# Patient Record
Sex: Female | Born: 2000 | Race: White | Hispanic: No | Marital: Single | Smoking: Never smoker
Health system: Southern US, Community
[De-identification: ages and names within clinical notes are randomized; demographics above are authoritative.]

## PROBLEM LIST (undated history)

## (undated) ENCOUNTER — Inpatient Hospital Stay (HOSPITAL_COMMUNITY): Payer: Self-pay

## (undated) DIAGNOSIS — E039 Hypothyroidism, unspecified: Secondary | ICD-10-CM

## (undated) DIAGNOSIS — G43909 Migraine, unspecified, not intractable, without status migrainosus: Secondary | ICD-10-CM

## (undated) DIAGNOSIS — R011 Cardiac murmur, unspecified: Secondary | ICD-10-CM

## (undated) DIAGNOSIS — E0590 Thyrotoxicosis, unspecified without thyrotoxic crisis or storm: Secondary | ICD-10-CM

## (undated) DIAGNOSIS — F419 Anxiety disorder, unspecified: Secondary | ICD-10-CM

## (undated) DIAGNOSIS — R350 Frequency of micturition: Secondary | ICD-10-CM

## (undated) DIAGNOSIS — J45909 Unspecified asthma, uncomplicated: Secondary | ICD-10-CM

## (undated) HISTORY — DX: Cardiac murmur, unspecified: R01.1

## (undated) HISTORY — DX: Anxiety disorder, unspecified: F41.9

---
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
## 2018-05-03 ENCOUNTER — Other Ambulatory Visit: Payer: Self-pay

## 2018-05-03 ENCOUNTER — Inpatient Hospital Stay (HOSPITAL_BASED_OUTPATIENT_CLINIC_OR_DEPARTMENT_OTHER): Payer: Managed Care, Other (non HMO)

## 2018-05-03 ENCOUNTER — Inpatient Hospital Stay (HOSPITAL_COMMUNITY)
Admission: AD | Admit: 2018-05-03 | Discharge: 2018-05-04 | DRG: 833 | Disposition: A | Discharge status: Home / Self Care | Payer: Managed Care, Other (non HMO) | Attending: Obstetrics and Gynecology | Admitting: Obstetrics and Gynecology

## 2018-05-03 ENCOUNTER — Encounter (HOSPITAL_COMMUNITY): Payer: Self-pay | Admitting: *Deleted

## 2018-05-03 DIAGNOSIS — Z3A28 28 weeks gestation of pregnancy: Secondary | ICD-10-CM | POA: Diagnosis not present

## 2018-05-03 DIAGNOSIS — O26893 Other specified pregnancy related conditions, third trimester: Secondary | ICD-10-CM | POA: Diagnosis not present

## 2018-05-03 DIAGNOSIS — R109 Unspecified abdominal pain: Secondary | ICD-10-CM

## 2018-05-03 HISTORY — DX: Thyrotoxicosis, unspecified without thyrotoxic crisis or storm: E05.90

## 2018-05-03 LAB — WET PREP, GENITAL
Clue Cells Wet Prep HPF POC: NONE SEEN
Sperm: NONE SEEN
TRICH WET PREP: NONE SEEN
YEAST WET PREP: NONE SEEN

## 2018-05-03 LAB — URINALYSIS, ROUTINE W REFLEX MICROSCOPIC
BILIRUBIN URINE: NEGATIVE
Glucose, UA: NEGATIVE mg/dL
Hgb urine dipstick: NEGATIVE
KETONES UR: 20 mg/dL — AB
LEUKOCYTES UA: NEGATIVE
Nitrite: NEGATIVE
Protein, ur: NEGATIVE mg/dL
SPECIFIC GRAVITY, URINE: 1.013 (ref 1.005–1.030)
pH: 7 (ref 5.0–8.0)

## 2018-05-03 LAB — AMNISURE RUPTURE OF MEMBRANE (ROM) NOT AT ARMC: AMNISURE: NEGATIVE

## 2018-05-03 LAB — OB RESULTS CONSOLE GBS: GBS: POSITIVE

## 2018-05-03 MED ORDER — DOCUSATE SODIUM 100 MG PO CAPS: 100.0000 mg | ORAL_CAPSULE | Freq: Every day | ORAL | Status: DC

## 2018-05-03 MED ORDER — LACTATED RINGERS IV BOLUS
1000.0000 mL | Freq: Once | INTRAVENOUS | Status: AC
  Administered 2018-05-03: 1000 mL via INTRAVENOUS

## 2018-05-03 MED ORDER — ZOLPIDEM TARTRATE 5 MG PO TABS: 5.0000 mg | ORAL_TABLET | Freq: Every evening | ORAL | Status: DC | PRN

## 2018-05-03 MED ORDER — PRENATAL MULTIVITAMIN CH: 1.0000 | ORAL_TABLET | Freq: Every day | ORAL | Status: DC

## 2018-05-03 MED ORDER — NIFEDIPINE 10 MG PO CAPS
10.0000 mg | ORAL_CAPSULE | ORAL | Status: AC | PRN
  Administered 2018-05-03 (×3): 10 mg via ORAL
  Filled 2018-05-03 (×4): qty 1

## 2018-05-03 MED ORDER — CALCIUM CARBONATE ANTACID 500 MG PO CHEW: 2.0000 | CHEWABLE_TABLET | ORAL | Status: DC | PRN

## 2018-05-03 MED ORDER — ACETAMINOPHEN 325 MG PO TABS
650.0000 mg | ORAL_TABLET | Freq: Four times a day (QID) | ORAL | Status: DC | PRN
  Administered 2018-05-04 (×2): 650 mg via ORAL
  Filled 2018-05-03 (×2): qty 2

## 2018-05-03 MED ORDER — BETAMETHASONE SOD PHOS & ACET 6 (3-3) MG/ML IJ SUSP
12.0000 mg | INTRAMUSCULAR | Status: DC
  Administered 2018-05-03: 12 mg via INTRAMUSCULAR
  Filled 2018-05-03: qty 2

## 2018-05-03 NOTE — MAU Provider Note (Signed)
Chief Complaint:  Abdominal Pain; Vaginal Pain; Groin Swelling; and Rupture of Membranes   First Provider Initiated Contact with Patient 05/03/18 1809      HPI: Jacqueline Meadows is a 17 y.o. G1P0000 at 24w0dwho presents to maternity admissions reporting a gush of fluid x 1 episode in the shower this morning with some wetness but no more gushes all day long today. She also reports she had intercourse and a sexual encounter with her boyfriend that lasted 2.5 hours today followed by vaginal swelling and abdominal cramping. The cramping is low in her abdomen, intermittent, and unchanged in intensity since onset.  She has not tried any treatments. There are no other associated symptoms.   She reports good fetal movement, denies vaginal bleeding, vaginal itching/burning, urinary symptoms, h/a, dizziness, n/v, or fever/chills.    HPI  Past Medical History: Past Medical History:  Diagnosis Date  . Anxiety   . Asthma   . Heart murmur    resolved by age [age]y/o.  [name] Kitchen Hyperthyroidism   . Migraine   . Premature baby    7 weeks early    Past obstetric history: OB History  Gravida Para Term Preterm AB Living  1 0 0 0 0 0  SAB TAB Ectopic Multiple Live Births  0 0 0 0 0    # Outcome Date GA Lbr Len/2nd Weight Sex Delivery Anes PTL Lv  1 Current             Past Surgical History: Past Surgical History:  Procedure Laterality Date  . FOOT SURGERY Bilateral 03-2015   Rods in both feet    Family History: Family History  Problem Relation Age of Onset  . Anxiety disorder Mother   . Depression Mother   . ADD / ADHD Mother   . Bipolar disorder Mother   . Migraines Mother   . Anxiety disorder Brother   . Depression Brother   . Bipolar disorder Paternal Aunt        3 paternal aunts bipolar dx and on disability  . Suicidality Paternal Aunt        paternal aunt attempted suicide 2 times  . Other Paternal Aunt        Hx of substance abuse  . ADD / ADHD Brother   . Ovarian cancer Maternal  Aunt     Social History: Social History   Tobacco Use  . Smoking status: Never Smoker  . Smokeless tobacco: Never Used  Substance Use Topics  . Alcohol use: No  . Drug use: No    Allergies:  Allergies  Allergen Reactions  . Vicodin [Hydrocodone-Acetaminophen] Rash    Meds:  Medications Prior to Admission  Medication Sig Dispense Refill Last Dose  . acetaminophen (TYLENOL) 500 MG tablet Take 1,000 mg by mouth every 6 (six) hours as needed.   Taking  . HYDROcodone-acetaminophen (NORCO) 10-325 MG tablet Take 1 tablet every 6 (six) hours as needed by mouth. Dispensed 25 with no refills     . ibuprofen (ADVIL,MOTRIN) 200 MG tablet Take 800 mg by mouth every 8 (eight) hours as needed.    Taking  . meperidine (DEMEROL) 50 MG tablet Take 1 tablet (50 mg total) every 6 (six) hours as needed by mouth for severe pain. 20 tablet 0   . promethazine (PHENERGAN) 12.5 MG tablet 1-2 tablets as needed for headache and nausea 30 tablet 3   . rizatriptan (MAXALT-MLT) 10 MG disintegrating tablet Take 1 tablet (10 mg total) by mouth as needed  for migraine. May repeat in 2 hours if needed 12 tablet 11     ROS:  Review of Systems  Constitutional: Negative for chills, fatigue and fever.  Eyes: Negative for visual disturbance.  Respiratory: Negative for shortness of breath.   Cardiovascular: Negative for chest pain.  Gastrointestinal: Positive for abdominal pain. Negative for nausea and vomiting.  Genitourinary: Positive for pelvic pain and vaginal discharge. Negative for difficulty urinating, dysuria, flank pain, vaginal bleeding and vaginal pain.  Neurological: Negative for dizziness and headaches.  Psychiatric/Behavioral: Negative.      I have reviewed patient's Past Medical Hx, Surgical Hx, Family Hx, Social Hx, medications and allergies.   Physical Exam   Patient Vitals for the past 24 hrs:  BP Temp Temp src Pulse Resp SpO2 Height Weight  05/03/18 2100 - - - - - 100 % - -  05/03/18 2033  108/62 - - 91 18 100 % - -  05/03/18 2014 - 99 F (37.2 C) Oral - - - 5' 5.5" (1.664 m) 57.2 kg  05/03/18 1905 127/68 - - - - - - -  05/03/18 1659 (!) 143/78 98.5 F (36.9 C) Oral 100 16 100 % 5' 5.5" (1.664 m) 57.2 kg   Constitutional: Well-developed, well-nourished female in no acute distress.  Cardiovascular: normal rate Respiratory: normal effort GI: Abd soft, non-tender, gravid appropriate for gestational age.  MS: Extremities nontender, no edema, normal ROM Neurologic: Alert and oriented x 4.  GU: Neg CVAT.  PELVIC EXAM: Cervix pink, visually closed, without lesion, moderate amount milky white discharge, no bleeding noted, vaginal walls and external genitalia with mild edema and erythema   Dilation: 1 Effacement (%): 50 Station: -3 Exam by:: Misty Stanley, CNM  FHT:  Baseline 140 , moderate variability, accelerations present, no decelerations Contractions: q 2-3 mins, mild to palpation   Labs: Results for orders placed or performed during the hospital encounter of 05/03/18 (from the past 24 hour(s))  Urinalysis, Routine w reflex microscopic     Status: Abnormal   Collection Time: 05/03/18  5:37 PM  Result Value Ref Range   Color, Urine YELLOW YELLOW   APPearance CLEAR CLEAR   Specific Gravity, Urine 1.013 1.005 - 1.030   pH 7.0 5.0 - 8.0   Glucose, UA NEGATIVE NEGATIVE mg/dL   Hgb urine dipstick NEGATIVE NEGATIVE   Bilirubin Urine NEGATIVE NEGATIVE   Ketones, ur 20 (A) NEGATIVE mg/dL   Protein, ur NEGATIVE NEGATIVE mg/dL   Nitrite NEGATIVE NEGATIVE   Leukocytes, UA NEGATIVE NEGATIVE  Amnisure rupture of membrane (rom)not at [facility]     Status: None   Collection Time: 05/03/18  5:49 PM  Result Value Ref Range   Amnisure ROM NEGATIVE   Wet prep, genital     Status: Abnormal   Collection Time: 05/03/18  5:59 PM  Result Value Ref Range   Yeast Wet Prep HPF POC NONE SEEN NONE SEEN   Trich, Wet Prep NONE SEEN NONE SEEN   Clue Cells Wet Prep HPF POC NONE SEEN NONE SEEN   WBC,  Wet Prep HPF POC MANY (A) NONE SEEN   Sperm NONE SEEN   Type and screen [facility] [place]     Status: None   Collection Time: 05/03/18  6:20 PM  Result Value Ref Range   ABO/RH(D) A NEG    Antibody Screen POS    Sample Expiration      05/06/2018 Performed at [facility], [address] [place], [place] [address]    --/--/A  NEG (10/30 1820)  Imaging:  No results found.  MAU Course/MDM: NST reviewed and reactive Cervix 1/50/-3, no pooling on exam and amnisure negative so no evidence of PPROM but possible preterm labor with early cervical dilation IV fluids given, BMZ 1st dose given Bedside US to confirm vertex Consult Dr Renaldo Fiddler with presentation, exam findings and test results.  Procardia 10 mg Q 20 minutes PRN x 3 doses ordered and pt given first dose. Second dose held with low BP.  Limited OB US ordered Pt admitted to HROB U for further evaluation and observation Second BMZ in 24 hours IV fluids Dr Renaldo Fiddler to see pt   Assessment: 1. Preterm labor in third trimester without delivery   2. [redacted] weeks gestation of pregnancy   3. Abdominal pain during pregnancy, third trimester     Plan: Admit to HROB Unit BMZ x 2 in 24 hours IV fluids [place]    Sharen Counter Certified Nurse-Midwife 05/03/2018 10:03 PM

## 2018-05-03 NOTE — H&P (Signed)
Jacqueline Meadows is a 17 y.o. female G1 @ 28wks presenting with c/o contractions.  Pt reports she had an episode of leaking fluid at 7am in the shower this morning.   After intercourse approx 11am, she noted an increase in contractions occurring Q3-18min and becoming more uncomfortable.  No vb and no further LOF.  Good FM.  Pt was given one dose of procardia 10mg  in MAU and noted decrease in ctx intensity.  Amniosure was negative.   Pregnancy uncomplicated.  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   OB History    Gravida  1   Para  0   Term  0   Preterm  0   AB  0   Living  0     SAB  0   TAB  0   Ectopic  0   Multiple  0   Live Births  0          Past Medical History:  Diagnosis Date  . Anxiety   . Asthma   . Heart murmur    resolved by age [age]y/o.  [name] Kitchen Hyperthyroidism   . Migraine   . Premature baby    7 weeks early   Past Surgical History:  Procedure Laterality Date  . FOOT SURGERY Bilateral 03-2015   Rods in both feet   Family History: family history includes ADD / ADHD in her brother and mother; Anxiety disorder in her brother and mother; Bipolar disorder in her mother and paternal aunt; Depression in her brother and mother; Migraines in her mother; Other in her paternal aunt; Ovarian cancer in her maternal aunt; Suicidality in her paternal aunt. Social History:  reports that she has never smoked. She has never used smokeless tobacco. She reports that she does not drink alcohol or use drugs.     Maternal Diabetes: No - GTT done at last OB appt, hasn't heard results Genetic Screening: Normal Maternal Ultrasounds/Referrals: normal Fetal Ultrasounds or other Referrals:  None Maternal Substance Abuse:  No Significant Maternal Medications:  None Significant Maternal Lab Results:  None Other Comments:  None  Blood pressure 127/68, pulse 100, temperature 99 F (37.2 C), temperature source Oral, resp. rate 16, height 5'  5.5" (1.664 m), weight 57.2 kg, last menstrual period 10/19/2017, SpO2 100 %. Exam Physical Exam   AF, VSS + FHT, reassuring Toco Q2-108min  Gen - NAD Abd - gravid, NT Ext - NT, no edema Cvx 1cm by CNM exam in MAU  Prenatal labs: ABO, Rh: --/--/A NEG (10/30 1820) Antibody: POS (10/30 1820) Rubella:   RPR:    HBsAg:    HIV:    GBS:   pending  Results for orders placed or performed during the hospital encounter of 05/03/18 (from the past 24 hour(s))  Urinalysis, Routine w reflex microscopic     Status: Abnormal   Collection Time: 05/03/18  5:37 PM  Result Value Ref Range   Color, Urine YELLOW YELLOW   APPearance CLEAR CLEAR   Specific Gravity, Urine 1.013 1.005 - 1.030   pH 7.0 5.0 - 8.0   Glucose, UA NEGATIVE NEGATIVE mg/dL   Hgb urine dipstick NEGATIVE NEGATIVE   Bilirubin Urine NEGATIVE NEGATIVE   Ketones, ur 20 (A) NEGATIVE mg/dL   Protein, ur NEGATIVE NEGATIVE mg/dL   Nitrite NEGATIVE NEGATIVE   Leukocytes, UA NEGATIVE NEGATIVE  Amnisure rupture of membrane (rom)not at [facility]     Status: None   Collection Time: 05/03/18  5:49 PM  Result Value Ref Range   Amnisure ROM NEGATIVE   Wet prep, genital     Status: Abnormal   Collection Time: 05/03/18  5:59 PM  Result Value Ref Range   Yeast Wet Prep HPF POC NONE SEEN NONE SEEN   Trich, Wet Prep NONE SEEN NONE SEEN   Clue Cells Wet Prep HPF POC NONE SEEN NONE SEEN   WBC, Wet Prep HPF POC MANY (A) NONE SEEN   Sperm NONE SEEN   Type and screen [facility]      Status: None   Collection Time: 05/03/18  6:20 PM  Result Value Ref Range   ABO/RH(D) A NEG    Antibody Screen POS    Sample Expiration      05/06/2018 Performed at [facility], [address], [place], [place] [address]     [place]:  cvx length 3.5cm, no funneling.  vtx   Assessment/Plan: 17 yo G1 @ 28 wks Preterm Labor  Continue procardia series, will start Mag tocolysis if ctx worsen S/p BMZ x 1, rpt in 24 hrs    Zelphia Cairo 05/03/2018, 8:31 PM

## 2018-05-03 NOTE — MAU Note (Signed)
Pt reports vaginal swelling and pain since intercourse this am, hurts to urinate. Pt reports abd tightening off/on ? Leaking fluid

## 2018-05-04 LAB — TYPE AND SCREEN
ABO/RH(D): A NEG
Antibody Screen: POSITIVE
UNIT DIVISION: 0
Unit division: 0

## 2018-05-04 LAB — BPAM RBC
Blood Product Expiration Date: 201911242359
Blood Product Expiration Date: 201911242359
Unit Type and Rh: 600
Unit Type and Rh: 600

## 2018-05-04 LAB — GC/CHLAMYDIA PROBE AMP (~~LOC~~) NOT AT ARMC
CHLAMYDIA, DNA PROBE: NEGATIVE
NEISSERIA GONORRHEA: NEGATIVE

## 2018-05-04 MED ORDER — BETAMETHASONE SOD PHOS & ACET 6 (3-3) MG/ML IJ SUSP
12.0000 mg | INTRAMUSCULAR | Status: AC
  Administered 2018-05-04: 12 mg via INTRAMUSCULAR
  Filled 2018-05-04: qty 2

## 2018-05-04 NOTE — Discharge Summary (Signed)
Admission Diagnosis: IUP at 28 weeks Preterm contractions  Discharge Diagnosis: Same  Hospital Course: 17 year old G 1 P 0 at 28 weeks admitted with pre term contractions that occurred after intercourse. Cervix noted to be 1 cm dilated. She was admitted for observation and given steroids and procardia. She stopped contracting relatively easily and was observed overnight.  BP 104/66 (BP Location: Right Arm)   Pulse 90   Temp 97.9 F (36.6 C) (Oral)   Resp 16   Ht 5' 5.5" (1.664 m)   Wt 57.2 kg   LMP 10/19/2017   SpO2 100%   BMI 20.67 kg/m  General alert and oriented Lung CTAB Car RRR  Results for orders placed or performed during the hospital encounter of 05/03/18 (from the past 24 hour(s))  Urinalysis, Routine w reflex microscopic     Status: Abnormal   Collection Time: 05/03/18  5:37 PM  Result Value Ref Range   Color, Urine YELLOW YELLOW   APPearance CLEAR CLEAR   Specific Gravity, Urine 1.013 1.005 - 1.030   pH 7.0 5.0 - 8.0   Glucose, UA NEGATIVE NEGATIVE mg/dL   Hgb urine dipstick NEGATIVE NEGATIVE   Bilirubin Urine NEGATIVE NEGATIVE   Ketones, ur 20 (A) NEGATIVE mg/dL   Protein, ur NEGATIVE NEGATIVE mg/dL   Nitrite NEGATIVE NEGATIVE   Leukocytes, UA NEGATIVE NEGATIVE  Amnisure rupture of membrane (rom)not at [facility]     Status: None   Collection Time: 05/03/18  5:49 PM  Result Value Ref Range   Amnisure ROM NEGATIVE   Wet prep, genital     Status: Abnormal   Collection Time: 05/03/18  5:59 PM  Result Value Ref Range   Yeast Wet Prep HPF POC NONE SEEN NONE SEEN   Trich, Wet Prep NONE SEEN NONE SEEN   Clue Cells Wet Prep HPF POC NONE SEEN NONE SEEN   WBC, Wet Prep HPF POC MANY (A) NONE SEEN   Sperm NONE SEEN   Type and screen [facility] [place]     Status: None (Preliminary result)   Collection Time: 05/03/18  6:20 PM  Result Value Ref Range   ABO/RH(D) A NEG    Antibody Screen POS    Sample Expiration 05/06/2018    Antibody Identification     PASSIVELY ACQUIRED ANTI-D Performed at [facility], [address] [place], [place] [address]    Unit Number [id number]    Blood Component Type RED CELLS,LR    Unit division 00    Status of Unit ALLOCATED    Transfusion Status OK TO TRANSFUSE    Crossmatch Result COMPATIBLE    Unit Number [id number]    Blood Component Type RED CELLS,LR    Unit division 00    Status of Unit ALLOCATED    Transfusion Status OK TO TRANSFUSE    Crossmatch Result COMPATIBLE    Patient discharged home in good condition Modified bedrest Followup in 1 week

## 2018-05-04 NOTE — Progress Notes (Signed)
Patient discharged home with significant other and her mother. Discharge teaching, home care, s/s PTL, and reasons to seek care discussed. Pt verbalized understanding.

## 2018-05-05 ENCOUNTER — Inpatient Hospital Stay (HOSPITAL_COMMUNITY)
Admission: AD | Admit: 2018-05-05 | Discharge: 2018-05-05 | Disposition: A | Discharge status: Home / Self Care | Payer: Managed Care, Other (non HMO) | Source: Ambulatory Visit | Attending: Obstetrics and Gynecology | Admitting: Obstetrics and Gynecology

## 2018-05-05 ENCOUNTER — Encounter (HOSPITAL_COMMUNITY): Payer: Self-pay | Admitting: *Deleted

## 2018-05-05 DIAGNOSIS — Z3A28 28 weeks gestation of pregnancy: Secondary | ICD-10-CM

## 2018-05-05 DIAGNOSIS — O4703 False labor before 37 completed weeks of gestation, third trimester: Secondary | ICD-10-CM | POA: Diagnosis not present

## 2018-05-05 DIAGNOSIS — O4702 False labor before 37 completed weeks of gestation, second trimester: Secondary | ICD-10-CM

## 2018-05-05 DIAGNOSIS — Z3689 Encounter for other specified antenatal screening: Secondary | ICD-10-CM

## 2018-05-05 LAB — URINALYSIS, ROUTINE W REFLEX MICROSCOPIC
Bilirubin Urine: NEGATIVE
Glucose, UA: NEGATIVE mg/dL
Ketones, ur: NEGATIVE mg/dL
Nitrite: NEGATIVE
Protein, ur: NEGATIVE mg/dL
Specific Gravity, Urine: 1.011 (ref 1.005–1.030)
pH: 7 (ref 5.0–8.0)

## 2018-05-05 LAB — CULTURE, BETA STREP (GROUP B ONLY)

## 2018-05-05 MED ORDER — NIFEDIPINE 10 MG PO CAPS: 10.0000 mg | ORAL_CAPSULE | Freq: Four times a day (QID) | ORAL | 0 refills | Status: DC | PRN

## 2018-05-05 NOTE — Discharge Instructions (Signed)
Braxton Hicks Contractions °Contractions of the uterus can occur throughout pregnancy, but they are not always a sign that you are in labor. You may have practice contractions called Braxton Hicks contractions. These false labor contractions are sometimes confused with true labor. °What are Braxton Hicks contractions? °Braxton Hicks contractions are tightening movements that occur in the muscles of the uterus before labor. Unlike true labor contractions, these contractions do not result in opening (dilation) and thinning of the cervix. Toward the end of pregnancy (32-34 weeks), Braxton Hicks contractions can happen more often and may become stronger. These contractions are sometimes difficult to tell apart from true labor because they can be very uncomfortable. You should not feel embarrassed if you go to the hospital with false labor. °Sometimes, the only way to tell if you are in true labor is for your health care provider to look for changes in the cervix. The health care provider will do a physical exam and may monitor your contractions. If you are not in true labor, the exam should show that your cervix is not dilating and your water has not broken. °If there are other health problems associated with your pregnancy, it is completely safe for you to be sent home with false labor. You may continue to have Braxton Hicks contractions until you go into true labor. °How to tell the difference between true labor and false labor °True labor °· Contractions last 30-70 seconds. °· Contractions become very regular. °· Discomfort is usually felt in the top of the uterus, and it spreads to the lower abdomen and low back. °· Contractions do not go away with walking. °· Contractions usually become more intense and increase in frequency. °· The cervix dilates and gets thinner. °False labor °· Contractions are usually shorter and not as strong as true labor contractions. °· Contractions are usually irregular. °· Contractions  are often felt in the front of the lower abdomen and in the groin. °· Contractions may go away when you walk around or change positions while lying down. °· Contractions get weaker and are shorter-lasting as time goes on. °· The cervix usually does not dilate or become thin. °Follow these instructions at home: °· Take over-the-counter and prescription medicines only as told by your health care provider. °· Keep up with your usual exercises and follow other instructions from your health care provider. °· Eat and drink lightly if you think you are going into labor. °· If Braxton Hicks contractions are making you uncomfortable: °? Change your position from lying down or resting to walking, or change from walking to resting. °? Sit and rest in a tub of warm water. °? Drink enough fluid to keep your urine pale yellow. Dehydration may cause these contractions. °? Do slow and deep breathing several times an hour. °· Keep all follow-up prenatal visits as told by your health care provider. This is important. °Contact a health care provider if: °· You have a fever. °· You have continuous pain in your abdomen. °Get help right away if: °· Your contractions become stronger, more regular, and closer together. °· You have fluid leaking or gushing from your vagina. °· You pass blood-tinged mucus (bloody show). °· You have bleeding from your vagina. °· You have low back pain that you never had before. °· You feel your baby’s head pushing down and causing pelvic pressure. °· Your baby is not moving inside you as much as it used to. °Summary °· Contractions that occur before labor are called Braxton   Hicks contractions, false labor, or practice contractions. °· Braxton Hicks contractions are usually shorter, weaker, farther apart, and less regular than true labor contractions. True labor contractions usually become progressively stronger and regular and they become more frequent. °· Manage discomfort from Braxton Hicks contractions by  changing position, resting in a warm bath, drinking plenty of water, or practicing deep breathing. °This information is not intended to replace advice given to you by your health care provider. Make sure you discuss any questions you have with your health care provider. °Document Released: 11/04/2016 Document Revised: 11/04/2016 Document Reviewed: 11/04/2016 °Elsevier Interactive Patient Education © 2018 [employer] ° °

## 2018-05-05 NOTE — MAU Provider Note (Signed)
History     CSN: [id number]  Arrival date and time: 05/05/18 1858   First Provider Initiated Contact with Patient 05/05/18 1942      No chief complaint on file.  G1 @28 .2 wks here with ctx. Reports ctx q5-20 min apart since last night. Ctx were painful last night but she did not want come in since she was just recently discharged. Ctx have been less painful today but her mother wanted her to come get checked. Rates 4/10. Denies VB and LOF. Reports good FM. No urinary sx. She is eating and drinking well.   OB History    Gravida  1   Para  0   Term  0   Preterm  0   AB  0   Living  0     SAB  0   TAB  0   Ectopic  0   Multiple  0   Live Births  0           Past Medical History:  Diagnosis Date  . Anxiety   . Asthma   . Heart murmur    resolved by age [age]y/o.  [name] Kitchen Hyperthyroidism   . Migraine   . Premature baby    7 weeks early    Past Surgical History:  Procedure Laterality Date  . FOOT SURGERY Bilateral 03-2015   Rods in both feet    Family History  Problem Relation Age of Onset  . Anxiety disorder Mother   . Depression Mother   . ADD / ADHD Mother   . Bipolar disorder Mother   . Migraines Mother   . Anxiety disorder Brother   . Depression Brother   . Bipolar disorder Paternal Aunt        3 paternal aunts bipolar dx and on disability  . Suicidality Paternal Aunt        paternal aunt attempted suicide 2 times  . Other Paternal Aunt        Hx of substance abuse  . ADD / ADHD Brother   . Ovarian cancer Maternal Aunt     Social History   Tobacco Use  . Smoking status: Never Smoker  . Smokeless tobacco: Never Used  Substance Use Topics  . Alcohol use: No  . Drug use: No    Allergies:  Allergies  Allergen Reactions  . Vicodin [Hydrocodone-Acetaminophen] Rash    Medications Prior to Admission  Medication Sig Dispense Refill Last Dose  . prenatal vitamin w/FE, FA (PRENATAL 1 + 1) 27-1 MG TABS tablet Take 1 tablet by mouth daily at  12 noon.   Past Week at Unknown time    Review of Systems  Gastrointestinal: Positive for abdominal pain.  Genitourinary: Negative for dysuria, vaginal bleeding and vaginal discharge.   Physical Exam   Blood pressure 125/76, pulse (!) 103, temperature 98.6 F (37 C), resp. rate 15, height 5\' 5"  (1.651 m), weight 56.2 kg, last menstrual period 10/19/2017.  Physical Exam  Constitutional: She is oriented to person, place, and time. She appears well-developed and well-nourished. No distress (appears comfortable).  HENT:  Head: Normocephalic and atraumatic.  Neck: Normal range of motion.  Respiratory: Effort normal. No respiratory distress.  GI: Soft. She exhibits no distension. There is no tenderness.  gravid  Genitourinary:  Genitourinary Comments: SVE FT/60  Musculoskeletal: Normal range of motion.  Neurological: She is alert and oriented to person, place, and time.  Skin: Skin is warm and dry.  Psychiatric: She has a normal mood  and affect.  EFM: 140 bpm, mod variability, + accels, no decels Toco: irritability with rare ctx  Results for orders placed or performed during the hospital encounter of 05/05/18 (from the past 24 hour(s))  Urinalysis, Routine w reflex microscopic     Status: Abnormal   Collection Time: 05/05/18  7:24 PM  Result Value Ref Range   Color, Urine YELLOW YELLOW   APPearance HAZY (A) CLEAR   Specific Gravity, Urine 1.011 1.005 - 1.030   pH 7.0 5.0 - 8.0   Glucose, UA NEGATIVE NEGATIVE mg/dL   Hgb urine dipstick SMALL (A) NEGATIVE   Bilirubin Urine NEGATIVE NEGATIVE   Ketones, ur NEGATIVE NEGATIVE mg/dL   Protein, ur NEGATIVE NEGATIVE mg/dL   Nitrite NEGATIVE NEGATIVE   Leukocytes, UA MODERATE (A) NEGATIVE   RBC / HPF 6-10 0 - 5 RBC/hpf   WBC, UA 6-10 0 - 5 WBC/hpf   Bacteria, UA RARE (A) NONE SEEN   Squamous Epithelial / LPF 11-20 0 - 5   MAU Course  Procedures  MDM Chart review: discharged home yesterday after 23 hr obs for preterm ctx, cervix  1cm. S/p BMZ x2. No evidence of PTL today. WIll offer Procardia for comfort. UA with leuks and Hgb, pt asymptomatic, will send UC. Stable for discharge home.  Assessment and Plan   1. [redacted] weeks gestation of pregnancy   2. NST (non-stress test) reactive   3. Preterm uterine contractions in second trimester, antepartum    Discharge home Follow up at [facility] in 3 days PTL precautions Rx Procardia  Allergies as of 05/05/2018      Reactions   Vicodin [hydrocodone-acetaminophen] Rash      Medication List    TAKE these medications   NIFEdipine 10 MG capsule Commonly known as:  PROCARDIA Take 1 capsule (10 mg total) by mouth every 6 (six) hours as needed. As needed for contractions   prenatal vitamin w/FE, FA 27-1 MG Tabs tablet Take 1 tablet by mouth daily at 12 noon.      Donette Larry, CNM 05/05/2018, 7:56 PM

## 2018-05-05 NOTE — MAU Note (Signed)
Pt was discharged from hospital yesterday. Started contracting as soon as she left the hospital. Rested all day yesterday while contracting intermittently. Says they were more painful last night than today. Denies any vag bleeding or leaking. Reports a thick clear colored discharge. Baby is moving well.

## 2018-05-06 ENCOUNTER — Encounter: Payer: Self-pay | Admitting: Advanced Practice Midwife

## 2018-05-06 DIAGNOSIS — O99820 Streptococcus B carrier state complicating pregnancy: Secondary | ICD-10-CM | POA: Insufficient documentation

## 2018-05-07 LAB — CULTURE, OB URINE

## 2018-05-16 ENCOUNTER — Encounter (HOSPITAL_COMMUNITY): Payer: Self-pay | Admitting: *Deleted

## 2018-05-16 ENCOUNTER — Inpatient Hospital Stay (HOSPITAL_COMMUNITY)
Admission: AD | Admit: 2018-05-16 | Discharge: 2018-05-17 | Disposition: A | Discharge status: Home / Self Care | Payer: Managed Care, Other (non HMO) | Source: Ambulatory Visit | Attending: Obstetrics and Gynecology | Admitting: Obstetrics and Gynecology

## 2018-05-16 DIAGNOSIS — O368130 Decreased fetal movements, third trimester, not applicable or unspecified: Secondary | ICD-10-CM | POA: Insufficient documentation

## 2018-05-16 DIAGNOSIS — O4703 False labor before 37 completed weeks of gestation, third trimester: Secondary | ICD-10-CM | POA: Diagnosis not present

## 2018-05-16 DIAGNOSIS — Z3A30 30 weeks gestation of pregnancy: Secondary | ICD-10-CM | POA: Diagnosis not present

## 2018-05-16 DIAGNOSIS — O4700 False labor before 37 completed weeks of gestation, unspecified trimester: Secondary | ICD-10-CM

## 2018-05-16 DIAGNOSIS — O479 False labor, unspecified: Secondary | ICD-10-CM

## 2018-05-16 DIAGNOSIS — Z3689 Encounter for other specified antenatal screening: Secondary | ICD-10-CM

## 2018-05-16 LAB — URINALYSIS, ROUTINE W REFLEX MICROSCOPIC
BILIRUBIN URINE: NEGATIVE
GLUCOSE, UA: NEGATIVE mg/dL
Hgb urine dipstick: NEGATIVE
KETONES UR: NEGATIVE mg/dL
NITRITE: NEGATIVE
PH: 7 (ref 5.0–8.0)
PROTEIN: NEGATIVE mg/dL
Specific Gravity, Urine: 1.006 (ref 1.005–1.030)

## 2018-05-16 LAB — FETAL FIBRONECTIN: Fetal Fibronectin: NEGATIVE

## 2018-05-16 MED ORDER — NIFEDIPINE 10 MG PO CAPS
10.0000 mg | ORAL_CAPSULE | ORAL | Status: DC | PRN
  Administered 2018-05-16 (×3): 10 mg via ORAL
  Filled 2018-05-16 (×3): qty 1

## 2018-05-16 NOTE — MAU Note (Signed)
PT SAYS SHE WOKE THIS AM- BABY IS MOVING LESS. - TOOK SHOWER- SOME MOVEMENT - THEN AT 3 PM-  NO MOVEMENT . CALLED DR- TOLD  TO COME IN.    TODAY- HAD SHINY D/C.- DENIES FLUID LEAKING.    LAST SEX-  1-2 WEEKS AGO.

## 2018-05-16 NOTE — MAU Provider Note (Signed)
Chief Complaint:  Decreased Fetal Movement   First Provider Initiated Contact with Patient 05/16/18 2028     Jacqueline Meadows  HPI: Jacqueline Meadows is a 17 y.o. G1P0000 at 47w6dwho presents to maternity admissions reporting decreased fetal movement since this morning.  Has only felt about 6 movements all day.  Didn't want to come in "because I'm stubborn".  Also had some vaginal mucous today She denies LOF, vaginal bleeding, vaginal itching/burning, urinary symptoms, h/a, dizziness, n/v, diarrhea, constipation or fever/chills.  She denies headache, visual changes or RUQ abdominal pain.  After monitoring her for a while, noted frequent uterine contractions.  I asked her if she has been having these all day and she indicated "yes".  She never mentioned it on initial interview   Was treated for PTL on 10/30 and sent home with Rx for Procardia   Has not taken any today, despite having Ucs all day  Other  This is a new problem. The current episode started today. The problem occurs intermittently. The problem has been unchanged. Pertinent negatives include no abdominal pain, chills, fever, headaches, myalgias, nausea, urinary symptoms, visual change or vomiting. Nothing aggravates the symptoms. She has tried nothing for the symptoms.   RN note: PT SAYS SHE WOKE THIS AM- BABY IS MOVING LESS. - TOOK SHOWER- SOME MOVEMENT - THEN AT 3 PM-  NO MOVEMENT . CALLED DR- TOLD  TO COME IN.    TODAY- HAD SHINY D/C.- DENIES FLUID LEAKING.    LAST SEX-  1-2 WEEKS AGO.   Past Medical History: Past Medical History:  Diagnosis Date  . Anxiety   . Asthma   . Heart murmur    resolved by age [age]y/o.  [name] Kitchen Hyperthyroidism   . Migraine   . Premature baby    7 weeks early    Past obstetric history: OB History  Gravida Para Term Preterm AB Living  1 0 0 0 0 0  SAB TAB Ectopic Multiple Live Births  0 0 0 0 0    # Outcome Date GA Lbr Len/2nd Weight Sex Delivery Anes PTL Lv  1 Current             Past Surgical  History: Past Surgical History:  Procedure Laterality Date  . FOOT SURGERY Bilateral 03-2015   Rods in both feet    Family History: Family History  Problem Relation Age of Onset  . Anxiety disorder Mother   . Depression Mother   . ADD / ADHD Mother   . Bipolar disorder Mother   . Migraines Mother   . Anxiety disorder Brother   . Depression Brother   . Bipolar disorder Paternal Aunt        3 paternal aunts bipolar dx and on disability  . Suicidality Paternal Aunt        paternal aunt attempted suicide 2 times  . Other Paternal Aunt        Hx of substance abuse  . ADD / ADHD Brother   . Ovarian cancer Maternal Aunt     Social History: Social History   Tobacco Use  . Smoking status: Never Smoker  . Smokeless tobacco: Never Used  Substance Use Topics  . Alcohol use: No  . Drug use: No    Allergies:  Allergies  Allergen Reactions  . Vicodin [Hydrocodone-Acetaminophen] Rash    Meds:  Medications Prior to Admission  Medication Sig Dispense Refill Last Dose  . NIFEdipine (PROCARDIA) 10 MG capsule Take 1 capsule (10 mg total) by  mouth every 6 (six) hours as needed. As needed for contractions 20 capsule 0   . prenatal vitamin w/FE, FA (PRENATAL 1 + 1) 27-1 MG TABS tablet Take 1 tablet by mouth daily at 12 noon.   Past Week at Unknown time    I have reviewed patient's Past Medical Hx, Surgical Hx, Family Hx, Social Hx, medications and allergies.   ROS:  Review of Systems  Constitutional: Negative for chills and fever.  Gastrointestinal: Negative for abdominal pain, nausea and vomiting.  Musculoskeletal: Negative for myalgias.  Neurological: Negative for headaches.   Other systems negative  Physical Exam   Patient Vitals for the past 24 hrs:  BP Temp Temp src Pulse Resp Height Weight  05/16/18 2019 134/66 98.3 F (36.8 C) Oral 88 18 5\' 5"  (1.651 m) 59.1 kg   Constitutional: Well-developed, well-nourished female in no acute distress.  Cardiovascular: normal  rate and rhythm Respiratory: normal effort, clear to auscultation bilaterally GI: Abd soft, non-tender, gravid appropriate for gestational age.   No rebound or guarding. MS: Extremities nontender, no edema, normal ROM Neurologic: Alert and oriented x 4.  GU: Neg CVAT.  PELVIC EXAM:   Dilation: Fingertip Effacement (%): 60 Station: -3 Exam by:: Wynelle Bourgeois, CNM  (unchanged from last exam)  FHT:  Baseline 145 , moderate variability, accelerations present, no decelerations Contractions: q 2 mins Irregular     Labs: Results for orders placed or performed during the hospital encounter of 05/16/18 (from the past 24 hour(s))  Urinalysis, Routine w reflex microscopic     Status: Abnormal   Collection Time: 05/16/18  8:26 PM  Result Value Ref Range   Color, Urine YELLOW YELLOW   APPearance HAZY (A) CLEAR   Specific Gravity, Urine 1.006 1.005 - 1.030   pH 7.0 5.0 - 8.0   Glucose, UA NEGATIVE NEGATIVE mg/dL   Hgb urine dipstick NEGATIVE NEGATIVE   Bilirubin Urine NEGATIVE NEGATIVE   Ketones, ur NEGATIVE NEGATIVE mg/dL   Protein, ur NEGATIVE NEGATIVE mg/dL   Nitrite NEGATIVE NEGATIVE   Leukocytes, UA LARGE (A) NEGATIVE   RBC / HPF 0-5 0 - 5 RBC/hpf   WBC, UA 21-50 0 - 5 WBC/hpf   Bacteria, UA FEW (A) NONE SEEN   Squamous Epithelial / LPF 11-20 0 - 5  Fetal fibronectin     Status: None   Collection Time: 05/16/18  9:31 PM  Result Value Ref Range   Fetal Fibronectin NEGATIVE NEGATIVE    --/--/A NEG (10/30 1820)  Imaging:    MAU Course/MDM: I have ordered labs and reviewed results. Urine sent to culture.  Last culture on 05/05/18 was negative.  NST reviewed and is reactive.  Good FM .[name] Kitchen  Procardia series given x 3 doses with reduction in contractions.  Now only has irritabilty.   No change in cervix and fetal fibronectin is NEGATIVE  Will send home and have  Her continue Procardia as ordered PTL precautions  Assessment: Single intrauterine pregnancy at [redacted]w[redacted]d Preterm uterine  contractions with negative fetal fibronectin and no change in cervix Decreased fetal movement, likely due to frequent contractions Reassuring FHR pattern, Category I  Plan: Discharge home Preterm Labor precautions and fetal kick counts Follow up in Office for prenatal visits and recheck of cervix Kick counts Encouraged to return here or to other Urgent Care/ED if she develops worsening of symptoms, increase in pain, fever, or other concerning symptoms.   Pt stable at time of discharge.  Wynelle Bourgeois CNM, MSN Certified Nurse-Midwife 05/16/2018  8:28 PM

## 2018-05-17 DIAGNOSIS — O368130 Decreased fetal movements, third trimester, not applicable or unspecified: Secondary | ICD-10-CM | POA: Diagnosis not present

## 2018-05-17 NOTE — Discharge Instructions (Signed)
Fetal Movement Counts Patient Name: ________________________________________________ Patient Due Date: ____________________ What is a fetal movement count? A fetal movement count is the number of times that you feel your baby move during a certain amount of time. This may also be called a fetal kick count. A fetal movement count is recommended for every pregnant woman. You may be asked to start counting fetal movements as early as week 28 of your pregnancy. Pay attention to when your baby is most active. You may notice your baby's sleep and wake cycles. You may also notice things that make your baby move more. You should do a fetal movement count:  When your baby is normally most active.  At the same time each day.  A good time to count movements is while you are resting, after having something to eat and drink. How do I count fetal movements? 1. Find a quiet, comfortable area. Sit, or lie down on your side. 2. Write down the date, the start time and stop time, and the number of movements that you felt between those two times. Take this information with you to your health care visits. 3. For 2 hours, count kicks, flutters, swishes, rolls, and jabs. You should feel at least 10 movements during 2 hours. 4. You may stop counting after you have felt 10 movements. 5. If you do not feel 10 movements in 2 hours, have something to eat and drink. Then, keep resting and counting for 1 hour. If you feel at least 4 movements during that hour, you may stop counting. Contact a health care provider if:  You feel fewer than 4 movements in 2 hours.  Your baby is not moving like he or she usually does. Date: ____________ Start time: ____________ Stop time: ____________ Movements: ____________ Date: ____________ Start time: ____________ Stop time: ____________ Movements: ____________ Date: ____________ Start time: ____________ Stop time: ____________ Movements: ____________ Date: ____________ Start time:  ____________ Stop time: ____________ Movements: ____________ Date: ____________ Start time: ____________ Stop time: ____________ Movements: ____________ Date: ____________ Start time: ____________ Stop time: ____________ Movements: ____________ Date: ____________ Start time: ____________ Stop time: ____________ Movements: ____________ Date: ____________ Start time: ____________ Stop time: ____________ Movements: ____________ Date: ____________ Start time: ____________ Stop time: ____________ Movements: ____________ This information is not intended to replace advice given to you by your health care provider. Make sure you discuss any questions you have with your health care provider. Document Released: 07/21/2006 Document Revised: 02/18/2016 Document Reviewed: 07/31/2015 Elsevier Interactive Patient Education  2018 [employer] Pelvic Rest Pelvic rest may be recommended if:  Your placenta is partially or completely covering the opening of your cervix (placenta previa).  There is bleeding between the wall of the uterus and the amniotic sac in the first trimester of pregnancy (subchorionic hemorrhage).  You went into labor too early (preterm labor).  Based on your overall health and the health of your baby, your health care provider will decide if pelvic rest is right for you. How do I rest my pelvis? For as long as told by your health care provider:  Do not have sex, sexual stimulation, or an orgasm.  Do not use tampons. Do not douche. Do not put anything in your vagina.  Do not lift anything that is heavier than 10 lb (4.5 kg).  Avoid activities that take a lot of effort (are strenuous).  Avoid any activity in which your pelvic muscles could become strained.  When should I seek medical care? Seek medical care if you have:  Cramping pain in your lower abdomen.  Vaginal discharge.  A low, dull backache.  Regular contractions.  Uterine tightening.  When should I seek  immediate medical care? Seek immediate medical care if:  You have vaginal bleeding and you are pregnant.  This information is not intended to replace advice given to you by your health care provider. Make sure you discuss any questions you have with your health care provider. Document Released: 10/16/2010 Document Revised: 11/27/2015 Document Reviewed: 12/23/2014 Elsevier Interactive Patient Education  2018 [employer].   Preterm Labor and Birth Information Pregnancy normally lasts 39-41 weeks. Preterm labor is when labor starts early. It starts before you have been pregnant for 37 whole weeks. What are the risk factors for preterm labor? Preterm labor is more likely to occur in women who:  Have an infection while pregnant.  Have a cervix that is short.  Have gone into preterm labor before.  Have had surgery on their cervix.  Are younger than age 46.  Are older than age 52.  Are African American.  Are pregnant with two or more babies.  Take street drugs while pregnant.  Smoke while pregnant.  Do not gain enough weight while pregnant.  Got pregnant right after another pregnancy.  What are the symptoms of preterm labor? Symptoms of preterm labor include:  Cramps. The cramps may feel like the cramps some women get during their period. The cramps may happen with watery poop (diarrhea).  Pain in the belly (abdomen).  Pain in the lower back.  Regular contractions or tightening. It may feel like your belly is getting tighter.  Pressure in the lower belly that seems to get stronger.  More fluid (discharge) leaking from the vagina. The fluid may be watery or bloody.  Water breaking.  Why is it important to notice signs of preterm labor? Babies who are born early may not be fully developed. They have a higher chance for:  Long-term heart problems.  Long-term lung problems.  Trouble controlling body systems, like breathing.  Bleeding in the brain.  A  condition called cerebral palsy.  Learning difficulties.  Death.  These risks are highest for babies who are born before 34 weeks of pregnancy. How is preterm labor treated? Treatment depends on:  How long you were pregnant.  Your condition.  The health of your baby.  Treatment may involve:  Having a stitch (suture) placed in your cervix. When you give birth, your cervix opens so the baby can come out. The stitch keeps the cervix from opening too soon.  Staying at the hospital.  Taking or getting medicines, such as: ? Hormone medicines. ? Medicines to stop contractions. ? Medicines to help the babys lungs develop. ? Medicines to prevent your baby from having cerebral palsy.  What should I do if I am in preterm labor? If you think you are going into labor too soon, call your doctor right away. How can I prevent preterm labor?  Do not use any tobacco products. ? Examples of these are cigarettes, chewing tobacco, and e-cigarettes. ? If you need help quitting, ask your doctor.  Do not use street drugs.  Do not use any medicines unless you ask your doctor if they are safe for you.  Talk with your doctor before taking any herbal supplements.  Make sure you gain enough weight.  Watch for infection. If you think you might have an infection, get it checked right away.  If you have gone into preterm labor before, tell  your doctor. This information is not intended to replace advice given to you by your health care provider. Make sure you discuss any questions you have with your health care provider. Document Released: 09/17/2008 Document Revised: 12/02/2015 Document Reviewed: 11/12/2015 Elsevier Interactive Patient Education  2018 [employer].

## 2018-05-18 LAB — CULTURE, OB URINE

## 2018-05-29 ENCOUNTER — Observation Stay (HOSPITAL_COMMUNITY)
Admission: AD | Admit: 2018-05-29 | Discharge: 2018-05-30 | Disposition: A | Discharge status: Home / Self Care | Payer: Managed Care, Other (non HMO) | Source: Ambulatory Visit | Attending: Obstetrics and Gynecology | Admitting: Obstetrics and Gynecology

## 2018-05-29 ENCOUNTER — Encounter (HOSPITAL_COMMUNITY): Payer: Self-pay

## 2018-05-29 ENCOUNTER — Other Ambulatory Visit: Payer: Self-pay

## 2018-05-29 DIAGNOSIS — O99820 Streptococcus B carrier state complicating pregnancy: Secondary | ICD-10-CM | POA: Diagnosis not present

## 2018-05-29 DIAGNOSIS — J45909 Unspecified asthma, uncomplicated: Secondary | ICD-10-CM | POA: Diagnosis not present

## 2018-05-29 DIAGNOSIS — Z3A31 31 weeks gestation of pregnancy: Secondary | ICD-10-CM

## 2018-05-29 DIAGNOSIS — E0590 Thyrotoxicosis, unspecified without thyrotoxic crisis or storm: Secondary | ICD-10-CM | POA: Insufficient documentation

## 2018-05-29 DIAGNOSIS — O99513 Diseases of the respiratory system complicating pregnancy, third trimester: Secondary | ICD-10-CM | POA: Insufficient documentation

## 2018-05-29 DIAGNOSIS — O4703 False labor before 37 completed weeks of gestation, third trimester: Secondary | ICD-10-CM | POA: Diagnosis not present

## 2018-05-29 DIAGNOSIS — O99283 Endocrine, nutritional and metabolic diseases complicating pregnancy, third trimester: Secondary | ICD-10-CM | POA: Insufficient documentation

## 2018-05-29 DIAGNOSIS — Z79899 Other long term (current) drug therapy: Secondary | ICD-10-CM | POA: Insufficient documentation

## 2018-05-29 DIAGNOSIS — O479 False labor, unspecified: Secondary | ICD-10-CM | POA: Diagnosis present

## 2018-05-29 DIAGNOSIS — O99343 Other mental disorders complicating pregnancy, third trimester: Secondary | ICD-10-CM | POA: Diagnosis not present

## 2018-05-29 DIAGNOSIS — F419 Anxiety disorder, unspecified: Secondary | ICD-10-CM | POA: Insufficient documentation

## 2018-05-29 DIAGNOSIS — Z818 Family history of other mental and behavioral disorders: Secondary | ICD-10-CM | POA: Insufficient documentation

## 2018-05-29 DIAGNOSIS — Z885 Allergy status to narcotic agent status: Secondary | ICD-10-CM | POA: Insufficient documentation

## 2018-05-29 DIAGNOSIS — O4700 False labor before 37 completed weeks of gestation, unspecified trimester: Secondary | ICD-10-CM | POA: Diagnosis present

## 2018-05-29 LAB — URINALYSIS, ROUTINE W REFLEX MICROSCOPIC
Bilirubin Urine: NEGATIVE
Glucose, UA: NEGATIVE mg/dL
HGB URINE DIPSTICK: NEGATIVE
Ketones, ur: NEGATIVE mg/dL
NITRITE: NEGATIVE
PROTEIN: NEGATIVE mg/dL
Specific Gravity, Urine: 1.006 (ref 1.005–1.030)
pH: 7 (ref 5.0–8.0)

## 2018-05-29 LAB — CBC
HEMATOCRIT: 34.4 % — AB (ref 36.0–46.0)
HEMOGLOBIN: 11.9 g/dL — AB (ref 12.0–15.0)
MCH: 31.5 pg (ref 26.0–34.0)
MCHC: 34.6 g/dL (ref 30.0–36.0)
MCV: 91 fL (ref 80.0–100.0)
Platelets: 250 10*3/uL (ref 150–400)
RBC: 3.78 MIL/uL — AB (ref 3.87–5.11)
RDW: 12.8 % (ref 11.5–15.5)
WBC: 14 10*3/uL — ABNORMAL HIGH (ref 4.0–10.5)
nRBC: 0 % (ref 0.0–0.2)

## 2018-05-29 LAB — WET PREP, GENITAL
Clue Cells Wet Prep HPF POC: NONE SEEN
Sperm: NONE SEEN
Trich, Wet Prep: NONE SEEN
YEAST WET PREP: NONE SEEN

## 2018-05-29 LAB — FETAL FIBRONECTIN: Fetal Fibronectin: POSITIVE — AB

## 2018-05-29 MED ORDER — ACETAMINOPHEN 325 MG PO TABS
650.0000 mg | ORAL_TABLET | ORAL | Status: DC | PRN
  Administered 2018-05-29: 650 mg via ORAL
  Filled 2018-05-29: qty 2

## 2018-05-29 MED ORDER — DOCUSATE SODIUM 100 MG PO CAPS
100.0000 mg | ORAL_CAPSULE | Freq: Every day | ORAL | Status: DC
  Filled 2018-05-29: qty 1

## 2018-05-29 MED ORDER — LACTATED RINGERS IV SOLN
INTRAVENOUS | Status: DC
  Administered 2018-05-29 – 2018-05-30 (×4): via INTRAVENOUS

## 2018-05-29 MED ORDER — FAMOTIDINE 20 MG PO TABS
20.0000 mg | ORAL_TABLET | Freq: Every day | ORAL | Status: DC
  Filled 2018-05-29: qty 1

## 2018-05-29 MED ORDER — NIFEDIPINE 10 MG PO CAPS
10.0000 mg | ORAL_CAPSULE | ORAL | Status: AC
  Administered 2018-05-29 (×3): 10 mg via ORAL
  Filled 2018-05-29 (×3): qty 1

## 2018-05-29 MED ORDER — CALCIUM CARBONATE ANTACID 500 MG PO CHEW: 2.0000 | CHEWABLE_TABLET | ORAL | Status: DC | PRN

## 2018-05-29 MED ORDER — ZOLPIDEM TARTRATE 5 MG PO TABS: 5.0000 mg | ORAL_TABLET | Freq: Every evening | ORAL | Status: DC | PRN

## 2018-05-29 MED ORDER — NIFEDIPINE 10 MG PO CAPS
20.0000 mg | ORAL_CAPSULE | Freq: Once | ORAL | Status: AC
  Administered 2018-05-29: 20 mg via ORAL
  Filled 2018-05-29: qty 2

## 2018-05-29 MED ORDER — NIFEDIPINE 10 MG PO CAPS
10.0000 mg | ORAL_CAPSULE | Freq: Four times a day (QID) | ORAL | Status: DC
  Administered 2018-05-29 – 2018-05-30 (×2): 10 mg via ORAL
  Filled 2018-05-29 (×2): qty 1

## 2018-05-29 MED ORDER — PRENATAL MULTIVITAMIN CH
1.0000 | ORAL_TABLET | Freq: Every day | ORAL | Status: DC
  Filled 2018-05-29: qty 1

## 2018-05-29 NOTE — H&P (Signed)
Jacqueline Meadows is a 17 y.o. G1 at 11w5dpresenting for preterm CTX.  Patient has been admitted previously with this diagnosis and received BMZ on 10/30 and 31.  During that admission, her cervix did not change from 1 cm and she did not require magnesium for tocolysis.  She was discharged home with procardia prn.  The patient reports severe pain and pressure for the last two days.  No LOF or VB.  Active FM.  In the MAU, the patient was contracting irregularly, cervix was unchanged from her last admit (1cm) and FFN was positive.  Patient was tearful and anxious and the decision was made to admit for 23 hour observation.   OB History    Gravida  1   Para  0   Term  0   Preterm  0   AB  0   Living  0     SAB  0   TAB  0   Ectopic  0   Multiple  0   Live Births  0          Past Medical History:  Diagnosis Date  . Anxiety   . Asthma   . Heart murmur    resolved by age [age]y/o.  .[name] KitchenHyperthyroidism   . Migraine   . Premature baby    7 weeks early   Past Surgical History:  Procedure Laterality Date  . FOOT SURGERY Bilateral 03-2015   Rods in both feet   Family History: family history includes ADD / ADHD in her brother and mother; Anxiety disorder in her brother and mother; Bipolar disorder in her mother and paternal aunt; Depression in her brother and mother; Migraines in her mother; Other in her paternal aunt; Ovarian cancer in her maternal aunt; Suicidality in her paternal aunt. Social History:  reports that she has never smoked. She has never used smokeless tobacco. She reports that she does not drink alcohol or use drugs.     Maternal Diabetes: No Genetic Screening: Normal Maternal Ultrasounds/Referrals: Normal Fetal Ultrasounds or other Referrals:  None Maternal Substance Abuse:  No Significant Maternal Medications:  Meds include: Other: Procardia Significant Maternal Lab Results:  Lab values include: Group B Strep positive, Rh negative Other Comments:   None  ROS Maternal Medical History:  Reason for admission: Contractions.   Contractions: Onset was 6-12 hours ago.   Frequency: irregular.   Perceived severity is mild.    Fetal activity: Perceived fetal activity is normal.   Last perceived fetal movement was within the past hour.    Prenatal complications: no prenatal complications Prenatal Complications - Diabetes: none.      Blood pressure 122/75, pulse 93, temperature 98.1 F (36.7 C), temperature source Oral, resp. rate 18, height '5\' 5"'  (1.651 m), weight 59.4 kg, last menstrual period 10/19/2017, SpO2 100 %. Maternal Exam:  Uterine Assessment: Contraction strength is mild.  Contraction frequency is irregular.   Abdomen: Patient reports no abdominal tenderness. Fundal height is c/w dates.       Physical Exam  Constitutional: She is oriented to person, place, and time. She appears well-developed and well-nourished.  GI: Soft. There is no rebound and no guarding.  Neurological: She is alert and oriented to person, place, and time.  Skin: Skin is warm and dry.  Psychiatric: She has a normal mood and affect. Her behavior is normal.    Prenatal labs: ABO, Rh: --/--/A NEG (11/25 1505) Antibody: POS (11/25 1505) Rubella:   RPR:    HBsAg:  HIV:    GBS:     Assessment/Plan: 17yo G1 at 28w5dwith PTC -IVF -CEFM -Procardia scheduled -Monitor for preterm labor   MLinda Hedges11/25/2019, 5:02 PM

## 2018-05-29 NOTE — Plan of Care (Signed)
Pt admitted for PTL

## 2018-05-29 NOTE — MAU Note (Addendum)
Last couple days have been pelvic pressure. Comes and goes.  This morning, started having contractions, would be above a 10. , they have eased up some, 7 now, feeling some pressure. No bleeding or leaking. Took Procardia at [phone]

## 2018-05-29 NOTE — MAU Provider Note (Signed)
History     CSN: [id number]  Arrival date and time: 05/29/18 1042   First Provider Initiated Contact with Patient 05/29/18 1154      Chief Complaint  Patient presents with  . Contractions  . pelivc pressure   HPI Ms.  Jacqueline Meadows is a 17 y.o. year old 211P0000 female at 7598w5d weeks gestation who presents to MAU reporting increased intermittent pelvic pressure for the last couple of days. She reports contractions started this morning causing her to double over in pain. She rates them 10/10 at the time. She states "they have eased up some;" rated a 7/10. She denies VB or LOF. She has been admitted for 23 hrs observation for PTL about 3 weeks ago. She takes Procardia 10 mg every 6 hrs prn UC's. She last took one at 0950 this morning.   Past Medical History:  Diagnosis Date  . Anxiety   . Asthma   . Heart murmur    resolved by age 40/17y/o.  [name] Kitchen. Hyperthyroidism   . Migraine   . Premature baby    7 weeks early    Past Surgical History:  Procedure Laterality Date  . FOOT SURGERY Bilateral 03-2015   Rods in both feet    Family History  Problem Relation Age of Onset  . Anxiety disorder Mother   . Depression Mother   . ADD / ADHD Mother   . Bipolar disorder Mother   . Migraines Mother   . Anxiety disorder Brother   . Depression Brother   . Bipolar disorder Paternal Aunt        3 paternal aunts bipolar dx and on disability  . Suicidality Paternal Aunt        paternal aunt attempted suicide 2 times  . Other Paternal Aunt        Hx of substance abuse  . ADD / ADHD Brother   . Ovarian cancer Maternal Aunt     Social History   Tobacco Use  . Smoking status: Never Smoker  . Smokeless tobacco: Never Used  Substance Use Topics  . Alcohol use: No  . Drug use: No    Allergies:  Allergies  Allergen Reactions  . Vicodin [Hydrocodone-Acetaminophen] Rash    Medications Prior to Admission  Medication Sig Dispense Refill Last Dose  . Doxylamine-Pyridoxine ER  (BONJESTA) 20-20 MG TBCR Take 1 tablet by mouth daily.    05/29/2018 at Unknown time  . NIFEdipine (PROCARDIA) 10 MG capsule Take 1 capsule (10 mg total) by mouth every 6 (six) hours as needed. As needed for contractions 20 capsule 0 05/29/2018 at Unknown time  . prenatal vitamin w/FE, FA (PRENATAL 1 + 1) 27-1 MG TABS tablet Take 1 tablet by mouth daily at 12 noon.   05/29/2018 at Unknown time    Review of Systems  Constitutional: Negative.   HENT: Negative.   Eyes: Negative.   Respiratory: Negative.   Cardiovascular: Negative.   Gastrointestinal: Positive for abdominal pain (lower ).  Endocrine: Negative.   Genitourinary: Positive for pelvic pain.  Musculoskeletal: Positive for back pain ("hurting a lot").  Skin: Negative.   Allergic/Immunologic: Negative.   Neurological: Negative.   Hematological: Negative.   Psychiatric/Behavioral: Negative.    Physical Exam   Blood pressure 128/71, pulse 100, temperature 98.7 F (37.1 C), temperature source Oral, resp. rate 18, weight 59.4 kg, last menstrual period 10/19/2017, SpO2 99 %.  Physical Exam  Nursing note and vitals reviewed. Constitutional: She is oriented to person, place, and time.  She appears well-developed and well-nourished.  HENT:  Head: Normocephalic and atraumatic.  Eyes: Pupils are equal, round, and reactive to light.  Neck: Normal range of motion.  Cardiovascular: Normal rate.  Respiratory: Effort normal.  GI: Soft.  Genitourinary:  Genitourinary Comments: Uterus: gravid, S=D, SE: cervix is smooth, pink, no lesions, small amt of thick, white vaginal d/c -- WP, GC/CT & fFN done, 1 cm/60%/soft/hight (no change in 2 wks), no CMT or friability, no adnexal tenderness   Musculoskeletal: Normal range of motion.  Neurological: She is alert and oriented to person, place, and time.  Skin: Skin is warm and dry.  Psychiatric: She has a normal mood and affect. Her behavior is normal. Judgment and thought content normal.    MAU  Course  Procedures  MDM CCUA Wet Prep GC/CT -- pending fFN Procardia 10 mg every 20 mins x 3 doses NST - FHR: 135 bpm / moderate variability / accels present / decels absent / TOCO: regular UC's and UI every 3-5 mins -  Some spacing and occ UI noted after Procardia, but patient reports her "pain is increasing" and she is crying, "because she is frustrated" per her mother.   *Consult with Dr. Vergie Living @ 1430 - notified of patient's complaints, assessments, lab & NST results, recommended tx plan admit for 23 hrs observation // TC to Dr. Langston Masker to notify of Dr. Vergie Living' recommendation - agrees with plan & will assume care of patient upon admission  Results for orders placed or performed during the hospital encounter of 05/29/18 (from the past 24 hour(s))  Urinalysis, Routine w reflex microscopic     Status: Abnormal   Collection Time: 05/29/18 11:34 AM  Result Value Ref Range   Color, Urine YELLOW YELLOW   APPearance HAZY (A) CLEAR   Specific Gravity, Urine 1.006 1.005 - 1.030   pH 7.0 5.0 - 8.0   Glucose, UA NEGATIVE NEGATIVE mg/dL   Hgb urine dipstick NEGATIVE NEGATIVE   Bilirubin Urine NEGATIVE NEGATIVE   Ketones, ur NEGATIVE NEGATIVE mg/dL   Protein, ur NEGATIVE NEGATIVE mg/dL   Nitrite NEGATIVE NEGATIVE   Leukocytes, UA LARGE (A) NEGATIVE   RBC / HPF 0-5 0 - 5 RBC/hpf   WBC, UA 0-5 0 - 5 WBC/hpf   Bacteria, UA RARE (A) NONE SEEN   Squamous Epithelial / LPF 6-10 0 - 5   Mucus PRESENT   Wet prep, genital     Status: Abnormal   Collection Time: 05/29/18 12:27 PM  Result Value Ref Range   Yeast Wet Prep HPF POC NONE SEEN NONE SEEN   Trich, Wet Prep NONE SEEN NONE SEEN   Clue Cells Wet Prep HPF POC NONE SEEN NONE SEEN   WBC, Wet Prep HPF POC MANY (A) NONE SEEN   Sperm NONE SEEN   Fetal fibronectin     Status: Abnormal   Collection Time: 05/29/18 12:27 PM  Result Value Ref Range   Fetal Fibronectin POSITIVE (A) NEGATIVE    Assessment and Plan  Preterm uterine contractions in  third trimester, antepartum - Place in 23 hr observation - LR @ 125 ml/hr - Continue Procardia 10 mg every 6 hrs prn UC's - Dr. Langston Masker to assume care of patient upon admission to [facility] unit    Raelyn Mora, MSN, CNM 05/29/2018, 12:16 PM

## 2018-05-30 DIAGNOSIS — O479 False labor, unspecified: Secondary | ICD-10-CM | POA: Diagnosis present

## 2018-05-30 DIAGNOSIS — O4700 False labor before 37 completed weeks of gestation, unspecified trimester: Secondary | ICD-10-CM | POA: Diagnosis present

## 2018-05-30 LAB — TYPE AND SCREEN
ABO/RH(D): A NEG
ANTIBODY SCREEN: POSITIVE
Unit division: 0
Unit division: 0

## 2018-05-30 LAB — AMNISURE RUPTURE OF MEMBRANE (ROM) NOT AT ARMC: Amnisure ROM: NEGATIVE

## 2018-05-30 LAB — BPAM RBC
BLOOD PRODUCT EXPIRATION DATE: 201912242359
Blood Product Expiration Date: 201912242359
UNIT TYPE AND RH: 600
Unit Type and Rh: 600

## 2018-05-30 LAB — GC/CHLAMYDIA PROBE AMP (~~LOC~~) NOT AT ARMC
Chlamydia: NEGATIVE
Neisseria Gonorrhea: NEGATIVE

## 2018-05-30 NOTE — Discharge Summary (Signed)
Physician Discharge Summary  Patient ID: Jacqueline SimmondsChrista M Spagna MRN: [id number] DOB/AGE: 07-11-2000 17 y.o.  Admit date: 05/29/2018 Discharge date: 05/30/2018  Admission Diagnoses:  Preterm contractions  Discharge Diagnoses:  Active Problems:   Preterm uterine contractions in third trimester, antepartum   Preterm contractions   Discharged Condition: stable  Hospital Course: Pt admitted for preterm contractions.  Symptoms improved overnight with IVF and procardia.  Cervix unchanged.  amniosure negative  Consults: None  Significant Diagnostic Studies: labs: amnisure   Discharge Exam: Blood pressure 107/66, pulse 77, temperature 97.9 F (36.6 C), temperature source Oral, resp. rate 19, height 5\' 5"  (1.651 m), weight 59.4 kg, last menstrual period 10/19/2017, SpO2 100 %.  Disposition: Discharge disposition: 01-Home or Self Care        Allergies as of 05/30/2018      Reactions   Vicodin [hydrocodone-acetaminophen] Rash      Medication List    TAKE these medications   BONJESTA 20-20 MG Tbcr Generic drug:  Doxylamine-Pyridoxine ER Take 1 tablet by mouth daily.   NIFEdipine 10 MG capsule Commonly known as:  PROCARDIA Take 1 capsule (10 mg total) by mouth every 6 (six) hours as needed. As needed for contractions   prenatal vitamin w/FE, FA 27-1 MG Tabs tablet Take 1 tablet by mouth daily at 12 noon.      Follow-up Information    [place] Schedule an appointment as soon as possible for a visit in 1 week(s).   Contact information: [address] [place] [address] [phone]           Signed: Zelphia CairoGretchen Lashawnda Hancox 05/30/2018, 9:55 AM

## 2018-05-30 NOTE — Progress Notes (Signed)
amnisure negative Cervix unchanged Will discharge home with f/u 1w

## 2018-05-30 NOTE — Plan of Care (Signed)
Pt discharged home with PTL instructions.

## 2018-05-30 NOTE — Plan of Care (Signed)
  Problem: Education: Goal: Knowledge of disease or condition will improve Outcome: Progressing   Problem: Education: Goal: Knowledge of the prescribed therapeutic regimen will improve Outcome: Progressing   Problem: Coping: Goal: Ability to identify and develop effective coping behavior will improve Outcome: Progressing   Problem: Coping: Goal: Participation in decision-making will improve Outcome: Progressing

## 2018-05-30 NOTE — Progress Notes (Signed)
Pt reports some leaking fluid. She is unsure if watery or c/w previous vaginal discharge.  Feels occasional pressure.  Denies regular ctx and vb,  Good FM  AF, VSS Gen - NAD Abd - gravid, NT Ext - NT, no edema Cvx 1/60/-2 vtx   A/P:  31+6 wks with preterm contractions S/p BMZ in October.   amnisure done - will discharge home if amnisure negative

## 2018-06-15 ENCOUNTER — Other Ambulatory Visit: Payer: Self-pay

## 2018-06-15 ENCOUNTER — Encounter (HOSPITAL_COMMUNITY): Payer: Self-pay

## 2018-06-15 ENCOUNTER — Observation Stay (HOSPITAL_COMMUNITY)
Admission: AD | Admit: 2018-06-15 | Discharge: 2018-06-16 | Disposition: A | Discharge status: Home / Self Care | Payer: Managed Care, Other (non HMO) | Source: Ambulatory Visit | Attending: Obstetrics and Gynecology | Admitting: Obstetrics and Gynecology

## 2018-06-15 DIAGNOSIS — J45909 Unspecified asthma, uncomplicated: Secondary | ICD-10-CM | POA: Diagnosis not present

## 2018-06-15 DIAGNOSIS — Z3A34 34 weeks gestation of pregnancy: Secondary | ICD-10-CM | POA: Diagnosis not present

## 2018-06-15 LAB — URINALYSIS, ROUTINE W REFLEX MICROSCOPIC
BILIRUBIN URINE: NEGATIVE
Glucose, UA: NEGATIVE mg/dL
HGB URINE DIPSTICK: NEGATIVE
Ketones, ur: 20 mg/dL — AB
Nitrite: NEGATIVE
PH: 6 (ref 5.0–8.0)
Protein, ur: NEGATIVE mg/dL
Specific Gravity, Urine: 1.025 (ref 1.005–1.030)

## 2018-06-15 LAB — SAMPLE TO BLOOD BANK

## 2018-06-15 LAB — CBC
HCT: 33.6 % — ABNORMAL LOW (ref 36.0–46.0)
Hemoglobin: 11.3 g/dL — ABNORMAL LOW (ref 12.0–15.0)
MCH: 30.9 pg (ref 26.0–34.0)
MCHC: 33.6 g/dL (ref 30.0–36.0)
MCV: 91.8 fL (ref 80.0–100.0)
PLATELETS: 249 10*3/uL (ref 150–400)
RBC: 3.66 MIL/uL — ABNORMAL LOW (ref 3.87–5.11)
RDW: 12.9 % (ref 11.5–15.5)
WBC: 15.5 10*3/uL — ABNORMAL HIGH (ref 4.0–10.5)
nRBC: 0 % (ref 0.0–0.2)

## 2018-06-15 MED ORDER — LACTATED RINGERS IV SOLN
INTRAVENOUS | Status: DC
  Administered 2018-06-15 (×2): via INTRAVENOUS

## 2018-06-15 MED ORDER — NIFEDIPINE 10 MG PO CAPS
10.0000 mg | ORAL_CAPSULE | ORAL | Status: AC | PRN
  Administered 2018-06-15 (×4): 10 mg via ORAL
  Filled 2018-06-15 (×4): qty 1

## 2018-06-15 MED ORDER — LACTATED RINGERS IV SOLN: INTRAVENOUS | Status: DC

## 2018-06-15 NOTE — MAU Note (Signed)
Pt with vaginal bldg starting on 06/06/18-present for approx 3 days on/off. Pt with lower, abd pressure 7/10. No watery leakage. Good FM.  Adah Perlhandra Kensey Luepke RN

## 2018-06-15 NOTE — MAU Provider Note (Signed)
Chief Complaint:  Vaginal Bleeding and Abdominal Pain   First Provider Initiated Contact with Patient 06/15/18 2208     HPI: Jacqueline Meadows is a 17 y.o. G1P0000 at 23w1dwho presents to maternity admissions reporting vaginal bleeding (small amount mixed with mucous) and contractions and severe pelvic/vaginal pressure. She reports good fetal movement, denies LOF, vaginal itching/burning, urinary symptoms, h/a, dizziness, n/v, diarrhea, constipation or fever/chills.    Was admitted for PTL with + fetal fibronectin on 05/29/18 and given tocolysis and Betamethasone, then discharged home the next day..  Vaginal Bleeding  The patient's primary symptoms include pelvic pain. The patient's pertinent negatives include no genital itching, genital lesions or genital odor. This is a new problem. The current episode started 1 to 4 weeks ago. The problem occurs intermittently. The problem has been unchanged. The pain is moderate. She is pregnant. Associated symptoms include abdominal pain and back pain. Pertinent negatives include no constipation, diarrhea, fever, frequency, headaches, nausea or vomiting. Nothing aggravates the symptoms. She has tried nothing for the symptoms.  Abdominal Pain  This is a new problem. The current episode started 1 to 4 weeks ago. The onset quality is gradual. The problem occurs intermittently. The problem has been unchanged. The pain is located in the suprapubic region, LLQ and RLQ. The pain is at a severity of 6/10. The pain is moderate. The quality of the pain is cramping and sharp. The abdominal pain does not radiate. Pertinent negatives include no constipation, diarrhea, fever, frequency, headaches, myalgias, nausea or vomiting. Nothing aggravates the pain. The pain is relieved by nothing. Treatments tried: PRocardia. The treatment provided no relief.    RN Note: Pt with vaginal bldg starting on 06/06/18-present for approx 3 days on/off. Pt with lower, abd pressure 7/10. No  watery leakage. Good FM. Adah Perl RN    Past Medical History: Past Medical History:  Diagnosis Date  . Anxiety   . Asthma   . Heart murmur    resolved by age [age]y/o.  [name] Kitchen Hyperthyroidism   . Migraine   . Premature baby    7 weeks early    Past obstetric history: OB History  Gravida Para Term Preterm AB Living  1 0 0 0 0 0  SAB TAB Ectopic Multiple Live Births  0 0 0 0 0    # Outcome Date GA Lbr Len/2nd Weight Sex Delivery Anes PTL Lv  1 Current             Past Surgical History: Past Surgical History:  Procedure Laterality Date  . FOOT SURGERY Bilateral 03-2015   Rods in both feet    Family History: Family History  Problem Relation Age of Onset  . Anxiety disorder Mother   . Depression Mother   . ADD / ADHD Mother   . Bipolar disorder Mother   . Migraines Mother   . Anxiety disorder Brother   . Depression Brother   . Bipolar disorder Paternal Aunt        3 paternal aunts bipolar dx and on disability  . Suicidality Paternal Aunt        paternal aunt attempted suicide 2 times  . Other Paternal Aunt        Hx of substance abuse  . ADD / ADHD Brother   . Ovarian cancer Maternal Aunt     Social History: Social History   Tobacco Use  . Smoking status: Never Smoker  . Smokeless tobacco: Never Used  Substance Use Topics  . Alcohol use:  No  . Drug use: No    Allergies:  Allergies  Allergen Reactions  . Vicodin [Hydrocodone-Acetaminophen] Rash    Meds:  Medications Prior to Admission  Medication Sig Dispense Refill Last Dose  . Doxylamine-Pyridoxine ER (BONJESTA) 20-20 MG TBCR Take 1 tablet by mouth daily.    05/29/2018 at Unknown time  . NIFEdipine (PROCARDIA) 10 MG capsule Take 1 capsule (10 mg total) by mouth every 6 (six) hours as needed. As needed for contractions 20 capsule 0 05/29/2018 at Unknown time  . prenatal vitamin w/FE, FA (PRENATAL 1 + 1) 27-1 MG TABS tablet Take 1 tablet by mouth daily at 12 noon.   05/29/2018 at Unknown time     I have reviewed patient's Past Medical Hx, Surgical Hx, Family Hx, Social Hx, medications and allergies.   ROS:  Review of Systems  Constitutional: Negative for fever.  Gastrointestinal: Positive for abdominal pain. Negative for constipation, diarrhea, nausea and vomiting.  Genitourinary: Positive for pelvic pain and vaginal bleeding. Negative for frequency.  Musculoskeletal: Positive for back pain. Negative for myalgias.  Neurological: Negative for headaches.   Other systems negative  Physical Exam   Patient Vitals for the past 24 hrs:  BP Temp Temp src Pulse Resp SpO2 Height Weight  06/15/18 2116 115/72 98.1 F (36.7 C) Oral 92 18 99 % 5\' 5"  (1.651 m) 60 kg   Constitutional: Well-developed, well-nourished female in no acute distress.  Cardiovascular: normal rate and rhythm Respiratory: normal effort, clear to auscultation bilaterally GI: Abd soft, non-tender, gravid appropriate for gestational age.   No rebound or guarding. MS: Extremities nontender, no edema, normal ROM Neurologic: Alert and oriented x 4.  GU: Neg CVAT.  PELVIC EXAM: Cervix pink, visually closed, without lesion, scant white creamy discharge, vaginal walls and external genitalia normal   No Blood seen.   Dilation: 1.5 Effacement (%): 80 Station: -1(-1-0) Presentation: Vertex Exam by:: Wynelle Bourgeois CNM   FHT:  Baseline 140 , moderate variability, accelerations present, no decelerations Contractions: q 2 mins Irregular    Labs: --/--/A NEG (11/25 1505) Results for orders placed or performed during the hospital encounter of 06/15/18 (from the past 24 hour(s))  Urinalysis, Routine w reflex microscopic     Status: Abnormal   Collection Time: 06/15/18  9:24 PM  Result Value Ref Range   Color, Urine YELLOW YELLOW   APPearance CLEAR CLEAR   Specific Gravity, Urine 1.025 1.005 - 1.030   pH 6.0 5.0 - 8.0   Glucose, UA NEGATIVE NEGATIVE mg/dL   Hgb urine dipstick NEGATIVE NEGATIVE   Bilirubin Urine  NEGATIVE NEGATIVE   Ketones, ur 20 (A) NEGATIVE mg/dL   Protein, ur NEGATIVE NEGATIVE mg/dL   Nitrite NEGATIVE NEGATIVE   Leukocytes, UA SMALL (A) NEGATIVE   RBC / HPF 0-5 0 - 5 RBC/hpf   WBC, UA 0-5 0 - 5 WBC/hpf   Bacteria, UA RARE (A) NONE SEEN   Squamous Epithelial / LPF 6-10 0 - 5   Mucus PRESENT   CBC     Status: Abnormal   Collection Time: 06/15/18 10:30 PM  Result Value Ref Range   WBC 15.5 (H) 4.0 - 10.5 K/uL   RBC 3.66 (L) 3.87 - 5.11 MIL/uL   Hemoglobin 11.3 (L) 12.0 - 15.0 g/dL   HCT 40.9 (L) 81.1 - 91.4 %   MCV 91.8 80.0 - 100.0 fL   MCH 30.9 26.0 - 34.0 pg   MCHC 33.6 30.0 - 36.0 g/dL   RDW 78.2 95.6 -  15.5 %   Platelets 249 150 - 400 K/uL   nRBC 0.0 0.0 - 0.2 %  Sample to Blood Bank     Status: None   Collection Time: 06/15/18 10:30 PM  Result Value Ref Range   Blood Bank Specimen SAMPLE AVAILABLE FOR TESTING    Sample Expiration      06/18/2018 Performed at [facility], [address] [place], [place] [address]     Imaging:  No results found.  MAU Course/MDM: I have ordered labs and reviewed results.  NST reviewed and is reactive with irregular contractions and irritability  We gave 2 liters of IV fluids and 4 doses of Procardia.  Uterine contractions persisted, and patient reported them to be a 5-6intensity. Consult Dr Vergie LivingPickens with presentation, exam findings and test results. He recommends admission Dr Elon SpannerLeger consulted.    Assessment: Single intrauterine pregnancy at 4018w2d Preterm uterine contractions Some change in cervix since last admit, no change today Report of bleeding at home, none on exam  Plan: Admit to antenatal Routine orders MD to follow   Wynelle BourgeoisMarie Williams CNM, MSN Certified Nurse-Midwife 06/15/2018 10:09 PM

## 2018-06-16 ENCOUNTER — Other Ambulatory Visit: Payer: Self-pay

## 2018-06-16 ENCOUNTER — Encounter (HOSPITAL_COMMUNITY): Payer: Self-pay

## 2018-06-16 MED ORDER — SODIUM CHLORIDE 0.9% FLUSH: 3.0000 mL | INTRAVENOUS | Status: DC | PRN

## 2018-06-16 MED ORDER — DOCUSATE SODIUM 100 MG PO CAPS: 100.0000 mg | ORAL_CAPSULE | Freq: Every day | ORAL | Status: DC

## 2018-06-16 MED ORDER — PRENATAL MULTIVITAMIN CH
1.0000 | ORAL_TABLET | Freq: Every day | ORAL | Status: DC
  Administered 2018-06-16: 1 via ORAL
  Filled 2018-06-16: qty 1

## 2018-06-16 MED ORDER — SODIUM CHLORIDE 0.9 % IV SOLN: 250.0000 mL | INTRAVENOUS | Status: DC | PRN

## 2018-06-16 MED ORDER — CALCIUM CARBONATE ANTACID 500 MG PO CHEW: 2.0000 | CHEWABLE_TABLET | ORAL | Status: DC | PRN

## 2018-06-16 MED ORDER — ACETAMINOPHEN 325 MG PO TABS
650.0000 mg | ORAL_TABLET | ORAL | Status: DC | PRN
  Administered 2018-06-16: 650 mg via ORAL
  Filled 2018-06-16: qty 2

## 2018-06-16 MED ORDER — BUTORPHANOL TARTRATE 1 MG/ML IJ SOLN: 1.0000 mg | INTRAMUSCULAR | Status: DC | PRN

## 2018-06-16 MED ORDER — ZOLPIDEM TARTRATE 5 MG PO TABS: 5.0000 mg | ORAL_TABLET | Freq: Every evening | ORAL | Status: DC | PRN

## 2018-06-16 MED ORDER — SODIUM CHLORIDE 0.9% FLUSH: 3.0000 mL | Freq: Two times a day (BID) | INTRAVENOUS | Status: DC

## 2018-06-16 NOTE — Discharge Summary (Signed)
Admission Diagnosis: PTL at 34 w 2 days  Discharge Diagnosis: Same  Hospital Course: 17 year old G 1 P 0 at 4034 w 2 days presents with preterm contractions. Admitted and given po tocolytics.  Overnight, contractions stopped easily and felt much better.  BP 120/78   Pulse (!) 114   Temp 97.7 F (36.5 C) (Oral)   Resp 18   Ht 5\' 5"  (1.651 m)   Wt 60 kg   LMP 10/19/2017   SpO2 100%   BMI 22.01 kg/m  Results for orders placed or performed during the hospital encounter of 06/15/18 (from the past 24 hour(s))  Urinalysis, Routine w reflex microscopic     Status: Abnormal   Collection Time: 06/15/18  9:24 PM  Result Value Ref Range   Color, Urine YELLOW YELLOW   APPearance CLEAR CLEAR   Specific Gravity, Urine 1.025 1.005 - 1.030   pH 6.0 5.0 - 8.0   Glucose, UA NEGATIVE NEGATIVE mg/dL   Hgb urine dipstick NEGATIVE NEGATIVE   Bilirubin Urine NEGATIVE NEGATIVE   Ketones, ur 20 (A) NEGATIVE mg/dL   Protein, ur NEGATIVE NEGATIVE mg/dL   Nitrite NEGATIVE NEGATIVE   Leukocytes, UA SMALL (A) NEGATIVE   RBC / HPF 0-5 0 - 5 RBC/hpf   WBC, UA 0-5 0 - 5 WBC/hpf   Bacteria, UA RARE (A) NONE SEEN   Squamous Epithelial / LPF 6-10 0 - 5   Mucus PRESENT   CBC     Status: Abnormal   Collection Time: 06/15/18 10:30 PM  Result Value Ref Range   WBC 15.5 (H) 4.0 - 10.5 K/uL   RBC 3.66 (L) 3.87 - 5.11 MIL/uL   Hemoglobin 11.3 (L) 12.0 - 15.0 g/dL   HCT 16.133.6 (L) 09.636.0 - 04.546.0 %   MCV 91.8 80.0 - 100.0 fL   MCH 30.9 26.0 - 34.0 pg   MCHC 33.6 30.0 - 36.0 g/dL   RDW 40.912.9 81.111.5 - 91.415.5 %   Platelets 249 150 - 400 K/uL   nRBC 0.0 0.0 - 0.2 %  Sample to Blood Bank     Status: None   Collection Time: 06/15/18 10:30 PM  Result Value Ref Range   Blood Bank Specimen SAMPLE AVAILABLE FOR TESTING    Sample Expiration      06/18/2018 Performed at [facility], [address] [place], [place] [address]    Abdomen soft and non tender  Will discharge home in good condition Advised to take  Procardia every 6 hours  Follow up follow up in office

## 2018-06-16 NOTE — H&P (Signed)
Jacqueline Meadows is a 17 y.o. female presenting for vaginal bleeding, contractions, and pressure. She has been admitted multiple times this pregnancy for the same problem. Has received BMZ series. Has been on procardia at home prn.  OB History    Gravida  1   Para  0   Term  0   Preterm  0   AB  0   Living  0     SAB  0   TAB  0   Ectopic  0   Multiple  0   Live Births  0          Past Medical History:  Diagnosis Date  . Anxiety   . Asthma   . Heart murmur    resolved by age [age]y/o.  [name] Kitchen. Hyperthyroidism   . Migraine   . Premature baby    7 weeks early   Past Surgical History:  Procedure Laterality Date  . FOOT SURGERY Bilateral 03-2015   Rods in both feet   Family History: family history includes ADD / ADHD in her brother and mother; Anxiety disorder in her brother and mother; Bipolar disorder in her mother and paternal aunt; Depression in her brother and mother; Migraines in her mother; Other in her paternal aunt; Ovarian cancer in her maternal aunt; Suicidality in her paternal aunt. Social History:  reports that she has never smoked. She has never used smokeless tobacco. She reports that she does not drink alcohol or use drugs.      ROS History Dilation: 1.5 Effacement (%): 80 Station: -1(-1-0) Exam by:: Jacqueline Meadows CNM Blood pressure 120/78, pulse (!) 114, temperature 97.7 F (36.5 C), temperature source Oral, resp. rate 18, height 5\' 5"  (1.651 m), weight 60 kg, last menstrual period 10/19/2017, SpO2 100 %. Exam Physical Exam  NAD, A&O NWOB Abd soft, nondistended, gravid A&O x 4 1.5/80/-1 by CNM  Prenatal labs: ABO, Rh: --/--/A NEG (11/25 1505) Antibody: POS (11/25 1505) Rubella: Nonimmune (06/13 0000) RPR: Nonreactive (06/13 0000)  HBsAg: Negative (06/13 0000)  HIV: Non-reactive (06/13 0000)  GBS: Positive (10/30 0000)   Assessment/Plan: 17 yo G1P0 @ 34.2 wga presenting w/vaginal bleeding and contractions. She has been admitted  multiple times for these issues this pregnancy. She is BMZ complete and has been on procardia at home. Ruled out for rupture by CNM. Bleeding improved and none seen on exam. Hgb stable. Given IVF and procardia and contractions persisted. No real change in SVE but given persistent ctxn, pt admitted for overnight obs.    Jacqueline Meadows 06/16/2018, 6:58 AM

## 2018-06-20 ENCOUNTER — Inpatient Hospital Stay (HOSPITAL_COMMUNITY)
Admission: AD | Admit: 2018-06-20 | Discharge: 2018-06-20 | Disposition: A | Discharge status: Home / Self Care | Payer: Managed Care, Other (non HMO) | Source: Ambulatory Visit | Attending: Obstetrics and Gynecology | Admitting: Obstetrics and Gynecology

## 2018-06-20 ENCOUNTER — Encounter (HOSPITAL_COMMUNITY): Payer: Self-pay | Admitting: *Deleted

## 2018-06-20 DIAGNOSIS — Z3A34 34 weeks gestation of pregnancy: Secondary | ICD-10-CM | POA: Diagnosis not present

## 2018-06-20 DIAGNOSIS — O4703 False labor before 37 completed weeks of gestation, third trimester: Secondary | ICD-10-CM | POA: Diagnosis not present

## 2018-06-20 DIAGNOSIS — D72829 Elevated white blood cell count, unspecified: Secondary | ICD-10-CM | POA: Diagnosis not present

## 2018-06-20 DIAGNOSIS — O4700 False labor before 37 completed weeks of gestation, unspecified trimester: Secondary | ICD-10-CM

## 2018-06-20 DIAGNOSIS — O479 False labor, unspecified: Secondary | ICD-10-CM

## 2018-06-20 DIAGNOSIS — O99820 Streptococcus B carrier state complicating pregnancy: Secondary | ICD-10-CM

## 2018-06-20 DIAGNOSIS — Z3689 Encounter for other specified antenatal screening: Secondary | ICD-10-CM

## 2018-06-20 LAB — URINALYSIS, ROUTINE W REFLEX MICROSCOPIC
Bilirubin Urine: NEGATIVE
Glucose, UA: NEGATIVE mg/dL
Hgb urine dipstick: NEGATIVE
KETONES UR: NEGATIVE mg/dL
Nitrite: NEGATIVE
PROTEIN: NEGATIVE mg/dL
Specific Gravity, Urine: 1.017 (ref 1.005–1.030)
pH: 8 (ref 5.0–8.0)

## 2018-06-20 MED ORDER — NIFEDIPINE 10 MG PO CAPS: 10.0000 mg | ORAL_CAPSULE | Freq: Four times a day (QID) | ORAL | 2 refills | Status: DC | PRN

## 2018-06-20 MED ORDER — NIFEDIPINE 10 MG PO CAPS: 10.0000 mg | ORAL_CAPSULE | Freq: Once | ORAL | Status: DC

## 2018-06-20 MED ORDER — DIPHENHYDRAMINE HCL 50 MG/ML IJ SOLN
25.0000 mg | Freq: Once | INTRAMUSCULAR | Status: AC
  Administered 2018-06-20: 25 mg via INTRAVENOUS
  Filled 2018-06-20: qty 1

## 2018-06-20 MED ORDER — BUTORPHANOL TARTRATE 1 MG/ML IJ SOLN
1.0000 mg | INTRAMUSCULAR | Status: DC | PRN
  Administered 2018-06-20 (×2): 1 mg via INTRAVENOUS
  Filled 2018-06-20 (×2): qty 1

## 2018-06-20 MED ORDER — LACTATED RINGERS IV SOLN
Freq: Once | INTRAVENOUS | Status: AC
  Administered 2018-06-20: 17:00:00 via INTRAVENOUS

## 2018-06-20 MED ORDER — NIFEDIPINE 10 MG PO CAPS
10.0000 mg | ORAL_CAPSULE | ORAL | Status: DC | PRN
  Administered 2018-06-20 (×3): 10 mg via ORAL
  Filled 2018-06-20 (×3): qty 1

## 2018-06-20 MED ORDER — NIFEDIPINE 10 MG PO CAPS: 10.0000 mg | ORAL_CAPSULE | Freq: Four times a day (QID) | ORAL | 1 refills | Status: DC | PRN

## 2018-06-20 NOTE — MAU Note (Signed)
Pt reports back pain and contractions all day, pain is worsening. Denies bleeding or ROM

## 2018-06-20 NOTE — MAU Provider Note (Signed)
History     CSN: [id number]  Arrival date and time: 06/20/18 1600   First Provider Initiated Contact with Patient 06/20/18 1619      Chief Complaint  Patient presents with  . Back Pain  . Contractions   HPI  Jacqueline Meadows is a 17 y.o. G1P0000 at [redacted]w[redacted]d who presents to MAU with chief complaint of preterm contractions. This is a recurrent problem, patient is s/p three 23 hour admissions for evaluation of preterm contractions.  Current episode started this morning. Patient endorses 10/10 back pain and 7-9/10 lower abdominal pain. She states she spent the day driving her family members on different errands and has not been able to drink water "like I should". She is very tearful and sobbing in MAU and also states she has not taken her prescribed Procardia in three days. She denies vaginal bleeding, abnormal vaginal discharge, leaking of fluid, decreased fetal movement, fever or falls.  OB History    Gravida  1   Para  0   Term  0   Preterm  0   AB  0   Living  0     SAB  0   TAB  0   Ectopic  0   Multiple  0   Live Births  0           Past Medical History:  Diagnosis Date  . Anxiety   . Asthma   . Heart murmur    resolved by age [age]y/o.  [name] Kitchen Hyperthyroidism   . Migraine   . Premature baby    7 weeks early    Past Surgical History:  Procedure Laterality Date  . FOOT SURGERY Bilateral 03-2015   Rods in both feet    Family History  Problem Relation Age of Onset  . Anxiety disorder Mother   . Depression Mother   . ADD / ADHD Mother   . Bipolar disorder Mother   . Migraines Mother   . Anxiety disorder Brother   . Depression Brother   . Bipolar disorder Paternal Aunt        3 paternal aunts bipolar dx and on disability  . Suicidality Paternal Aunt        paternal aunt attempted suicide 2 times  . Other Paternal Aunt        Hx of substance abuse  . ADD / ADHD Brother   . Ovarian cancer Maternal Aunt     Social History   Tobacco Use  .  Smoking status: Never Smoker  . Smokeless tobacco: Never Used  Substance Use Topics  . Alcohol use: No  . Drug use: No    Allergies:  Allergies  Allergen Reactions  . Vicodin [Hydrocodone-Acetaminophen] Rash    Medications Prior to Admission  Medication Sig Dispense Refill Last Dose  . Doxylamine-Pyridoxine ER (BONJESTA) 20-20 MG TBCR Take 1 tablet by mouth daily.    06/19/2018 at Unknown time  . prenatal vitamin w/FE, FA (PRENATAL 1 + 1) 27-1 MG TABS tablet Take 1 tablet by mouth daily at 12 noon.   06/19/2018 at Unknown time  . NIFEdipine (PROCARDIA) 10 MG capsule Take 1 capsule (10 mg total) by mouth every 6 (six) hours as needed. As needed for contractions 20 capsule 0 06/17/2018    Review of Systems  Constitutional: Negative for fever.  Gastrointestinal: Positive for abdominal pain.  Genitourinary: Negative for vaginal bleeding, vaginal discharge and vaginal pain.  Musculoskeletal: Positive for back pain.  Neurological: Negative for headaches.  All other systems reviewed and are negative.  Physical Exam   Blood pressure 124/75, pulse 96, temperature 98.4 F (36.9 C), temperature source Oral, resp. rate 16, height 5\' 5"  (1.651 m), weight 59.9 kg, last menstrual period 10/19/2017, SpO2 99 %.  Physical Exam  Nursing note and vitals reviewed. Constitutional: She is oriented to person, place, and time. She appears well-developed and well-nourished.  Cardiovascular: Normal rate and intact distal pulses.  Respiratory: Effort normal and breath sounds normal.  GI: Soft. Bowel sounds are normal.  Genitourinary:    Vagina normal.     No vaginal discharge.   Neurological: She is alert and oriented to person, place, and time. She has normal reflexes.  Skin: Skin is warm and dry.  Psychiatric: She has a normal mood and affect. Her behavior is normal. Judgment and thought content normal.    MAU Course/MDM   --S/p four hours continuous monitoring --Reactive fetal tracing:  baseline 135, moderate variability, positive accelerations, no decelerations --Toco: irregular contractions q 30 seconds to 2 minutes, palpate mild --Cervix tight 1.5cm/70%/ballotable. Unchanged from most recent admission --Stood at bedside from 1724 to 1736 palpating contractions. Patient reporting pain 6/10 abdominal contractions, not palpable by Provider. --Pain reduced from 10/10 to 5-6/10 with treatments administered in MAU --S/p [facility] 10/30 and 10/31 --Plan of care discussed with Dr. Adrian Blackwater after initial two hours of monitoring. Advised four hours prolonged monitoring --HPI and management reviewed with Dr. Renaldo Fiddler prior to discharge   Patient Vitals for the past 24 hrs:  BP Temp Temp src Pulse Resp SpO2 Height Weight  06/20/18 2029 116/70 - - 95 16 97 % - -  06/20/18 1743 129/79 - - - - - - -  06/20/18 1716 125/74 - - - - - - -  06/20/18 1655 120/64 - - - - - - -  06/20/18 1608 124/75 98.4 F (36.9 C) Oral 96 16 99 % 5\' 5"  (1.651 m) 59.9 kg   Results for orders placed or performed during the hospital encounter of 06/20/18 (from the past 24 hour(s))  Urinalysis, Routine w reflex microscopic     Status: Abnormal   Collection Time: 06/20/18  4:10 PM  Result Value Ref Range   Color, Urine YELLOW YELLOW   APPearance CLOUDY (A) CLEAR   Specific Gravity, Urine 1.017 1.005 - 1.030   pH 8.0 5.0 - 8.0   Glucose, UA NEGATIVE NEGATIVE mg/dL   Hgb urine dipstick NEGATIVE NEGATIVE   Bilirubin Urine NEGATIVE NEGATIVE   Ketones, ur NEGATIVE NEGATIVE mg/dL   Protein, ur NEGATIVE NEGATIVE mg/dL   Nitrite NEGATIVE NEGATIVE   Leukocytes, UA LARGE (A) NEGATIVE   RBC / HPF 0-5 0 - 5 RBC/hpf   WBC, UA 11-20 0 - 5 WBC/hpf   Bacteria, UA RARE (A) NONE SEEN   Squamous Epithelial / LPF 6-10 0 - 5   Mucus PRESENT    Amorphous Crystal PRESENT     Meds ordered this encounter  Medications  . lactated ringers infusion  . NIFEdipine (PROCARDIA) capsule 10 mg  . butorphanol (STADOL) injection 1 mg  .  diphenhydrAMINE (BENADRYL) injection 25 mg  . NIFEdipine (PROCARDIA) 10 MG capsule    Sig: Take 1 capsule (10 mg total) by mouth every 6 (six) hours as needed. As needed for contractions    Dispense:  20 capsule    Refill:  1    Order Specific Question:   Supervising Provider    Answer:   Samara Snide  Assessment and Plan  --17 y.o. G1P0000 at 5036w6d s/p four hours of monitoring --Preterm contractions without cervical change --Reactive fetal tracing --Elevated Leukocytes and WBCs, urine culture pending --Per Dr. Renaldo FiddlerAdkins, discharge home in stable condition  F/U: Patient to keep 0930 appt with Dr. Vincente PoliGrewal tomorrow  Calvert CantorSamantha C , CNM 06/20/2018, 8:42 PM

## 2018-06-20 NOTE — Discharge Instructions (Signed)
Braxton Hicks Contractions °Contractions of the uterus can occur throughout pregnancy, but they are not always a sign that you are in labor. You may have practice contractions called Braxton Hicks contractions. These false labor contractions are sometimes confused with true labor. °What are Braxton Hicks contractions? °Braxton Hicks contractions are tightening movements that occur in the muscles of the uterus before labor. Unlike true labor contractions, these contractions do not result in opening (dilation) and thinning of the cervix. Toward the end of pregnancy (32-34 weeks), Braxton Hicks contractions can happen more often and may become stronger. These contractions are sometimes difficult to tell apart from true labor because they can be very uncomfortable. You should not feel embarrassed if you go to the hospital with false labor. °Sometimes, the only way to tell if you are in true labor is for your health care provider to look for changes in the cervix. The health care provider will do a physical exam and may monitor your contractions. If you are not in true labor, the exam should show that your cervix is not dilating and your water has not broken. °If there are other health problems associated with your pregnancy, it is completely safe for you to be sent home with false labor. You may continue to have Braxton Hicks contractions until you go into true labor. °How to tell the difference between true labor and false labor °True labor °· Contractions last 30-70 seconds. °· Contractions become very regular. °· Discomfort is usually felt in the top of the uterus, and it spreads to the lower abdomen and low back. °· Contractions do not go away with walking. °· Contractions usually become more intense and increase in frequency. °· The cervix dilates and gets thinner. °False labor °· Contractions are usually shorter and not as strong as true labor contractions. °· Contractions are usually irregular. °· Contractions  are often felt in the front of the lower abdomen and in the groin. °· Contractions may go away when you walk around or change positions while lying down. °· Contractions get weaker and are shorter-lasting as time goes on. °· The cervix usually does not dilate or become thin. °Follow these instructions at home: °· Take over-the-counter and prescription medicines only as told by your health care provider. °· Keep up with your usual exercises and follow other instructions from your health care provider. °· Eat and drink lightly if you think you are going into labor. °· If Braxton Hicks contractions are making you uncomfortable: °? Change your position from lying down or resting to walking, or change from walking to resting. °? Sit and rest in a tub of warm water. °? Drink enough fluid to keep your urine pale yellow. Dehydration may cause these contractions. °? Do slow and deep breathing several times an hour. °· Keep all follow-up prenatal visits as told by your health care provider. This is important. °Contact a health care provider if: °· You have a fever. °· You have continuous pain in your abdomen. °Get help right away if: °· Your contractions become stronger, more regular, and closer together. °· You have fluid leaking or gushing from your vagina. °· You pass blood-tinged mucus (bloody show). °· You have bleeding from your vagina. °· You have low back pain that you never had before. °· You feel your baby’s head pushing down and causing pelvic pressure. °· Your baby is not moving inside you as much as it used to. °Summary °· Contractions that occur before labor are called Braxton   Hicks contractions, false labor, or practice contractions. °· Braxton Hicks contractions are usually shorter, weaker, farther apart, and less regular than true labor contractions. True labor contractions usually become progressively stronger and regular and they become more frequent. °· Manage discomfort from Braxton Hicks contractions by  changing position, resting in a warm bath, drinking plenty of water, or practicing deep breathing. °This information is not intended to replace advice given to you by your health care provider. Make sure you discuss any questions you have with your health care provider. °Document Released: 11/04/2016 Document Revised: 11/04/2016 Document Reviewed: 11/04/2016 °Elsevier Interactive Patient Education © 2018 [employer] ° °

## 2018-06-22 LAB — CULTURE, OB URINE: Culture: 70000 — AB

## 2018-06-25 ENCOUNTER — Encounter (HOSPITAL_COMMUNITY): Payer: Self-pay

## 2018-06-25 ENCOUNTER — Other Ambulatory Visit: Payer: Self-pay

## 2018-06-25 ENCOUNTER — Inpatient Hospital Stay (HOSPITAL_COMMUNITY)
Admission: AD | Admit: 2018-06-25 | Discharge: 2018-06-25 | Disposition: A | Discharge status: Home / Self Care | Payer: Managed Care, Other (non HMO) | Source: Ambulatory Visit | Attending: Obstetrics & Gynecology | Admitting: Obstetrics & Gynecology

## 2018-06-25 DIAGNOSIS — O99343 Other mental disorders complicating pregnancy, third trimester: Secondary | ICD-10-CM | POA: Diagnosis not present

## 2018-06-25 DIAGNOSIS — O479 False labor, unspecified: Secondary | ICD-10-CM

## 2018-06-25 DIAGNOSIS — E0590 Thyrotoxicosis, unspecified without thyrotoxic crisis or storm: Secondary | ICD-10-CM | POA: Diagnosis not present

## 2018-06-25 DIAGNOSIS — O4703 False labor before 37 completed weeks of gestation, third trimester: Secondary | ICD-10-CM

## 2018-06-25 DIAGNOSIS — Z3A35 35 weeks gestation of pregnancy: Secondary | ICD-10-CM | POA: Insufficient documentation

## 2018-06-25 DIAGNOSIS — O99283 Endocrine, nutritional and metabolic diseases complicating pregnancy, third trimester: Secondary | ICD-10-CM | POA: Insufficient documentation

## 2018-06-25 DIAGNOSIS — O26893 Other specified pregnancy related conditions, third trimester: Secondary | ICD-10-CM | POA: Diagnosis not present

## 2018-06-25 DIAGNOSIS — O99513 Diseases of the respiratory system complicating pregnancy, third trimester: Secondary | ICD-10-CM | POA: Diagnosis not present

## 2018-06-25 DIAGNOSIS — N898 Other specified noninflammatory disorders of vagina: Secondary | ICD-10-CM | POA: Insufficient documentation

## 2018-06-25 DIAGNOSIS — M549 Dorsalgia, unspecified: Secondary | ICD-10-CM | POA: Diagnosis present

## 2018-06-25 DIAGNOSIS — O4700 False labor before 37 completed weeks of gestation, unspecified trimester: Secondary | ICD-10-CM

## 2018-06-25 DIAGNOSIS — J45909 Unspecified asthma, uncomplicated: Secondary | ICD-10-CM | POA: Diagnosis not present

## 2018-06-25 DIAGNOSIS — F419 Anxiety disorder, unspecified: Secondary | ICD-10-CM | POA: Diagnosis not present

## 2018-06-25 DIAGNOSIS — G43909 Migraine, unspecified, not intractable, without status migrainosus: Secondary | ICD-10-CM | POA: Diagnosis not present

## 2018-06-25 LAB — URINALYSIS, ROUTINE W REFLEX MICROSCOPIC
Bilirubin Urine: NEGATIVE
Glucose, UA: NEGATIVE mg/dL
Hgb urine dipstick: NEGATIVE
Ketones, ur: NEGATIVE mg/dL
Nitrite: NEGATIVE
Protein, ur: NEGATIVE mg/dL
Specific Gravity, Urine: 1.015 (ref 1.005–1.030)
pH: 6 (ref 5.0–8.0)

## 2018-06-25 LAB — WET PREP, GENITAL
Clue Cells Wet Prep HPF POC: NONE SEEN
Sperm: NONE SEEN
Trich, Wet Prep: NONE SEEN
Yeast Wet Prep HPF POC: NONE SEEN

## 2018-06-25 NOTE — MAU Provider Note (Signed)
History    CSN: [id number] Arrival date and time: 06/25/18 1427  Chief Complaint  Patient presents with  . Back Pain  . Contractions  . Rupture of Membranes   HPI 17yo G1P0 at 342w4d who presents with concern for ROM. Pregnancy has been complicated by 3rd trimester vaginal bleeding, threatened preterm labor. She has been seen and admitted multiple times for threatened preterm labor and currently takes procardia at home. States noticed her mucous plug came out about 2 days ago - noticed small, pea-sized clot and mucous coming out. This morning woke up around 0400 with back pain. Tried to go about her day but noticed increased discharge coming out all day and felt discomfort with contractions. Took procardia then decided to come to MAU. Denies vaginal bleeding, reports normal fetal movement.   OB History    Gravida  1   Para  0   Term  0   Preterm  0   AB  0   Living  0     SAB  0   TAB  0   Ectopic  0   Multiple  0   Live Births  0           Past Medical History:  Diagnosis Date  . Anxiety   . Asthma   . Heart murmur    resolved by age [age]y/o.  [name] Kitchen. Hyperthyroidism   . Migraine   . Premature baby    7 weeks early    Past Surgical History:  Procedure Laterality Date  . FOOT SURGERY Bilateral 03-2015   Rods in both feet    Family History  Problem Relation Age of Onset  . Anxiety disorder Mother   . Depression Mother   . ADD / ADHD Mother   . Bipolar disorder Mother   . Migraines Mother   . Anxiety disorder Brother   . Depression Brother   . Bipolar disorder Paternal Aunt        3 paternal aunts bipolar dx and on disability  . Suicidality Paternal Aunt        paternal aunt attempted suicide 2 times  . Other Paternal Aunt        Hx of substance abuse  . ADD / ADHD Brother   . Ovarian cancer Maternal Aunt     Social History   Tobacco Use  . Smoking status: Never Smoker  . Smokeless tobacco: Never Used  Substance Use Topics  . Alcohol use: No   . Drug use: No    Allergies:  Allergies  Allergen Reactions  . Vicodin [Hydrocodone-Acetaminophen] Rash    Medications Prior to Admission  Medication Sig Dispense Refill Last Dose  . Doxylamine-Pyridoxine ER (BONJESTA) 20-20 MG TBCR Take 1 tablet by mouth daily.    06/25/2018 at Unknown time  . NIFEdipine (PROCARDIA) 10 MG capsule Take 1 capsule (10 mg total) by mouth every 6 (six) hours as needed. As needed for contractions 20 capsule 2 06/25/2018 at Unknown time  . prenatal vitamin w/FE, FA (PRENATAL 1 + 1) 27-1 MG TABS tablet Take 1 tablet by mouth daily at 12 noon.   06/19/2018 at Unknown time    Review of Systems  Constitutional: Positive for activity change and fatigue. Negative for fever.  Gastrointestinal: Positive for abdominal pain.  Genitourinary: Positive for vaginal discharge. Negative for dysuria, vaginal bleeding and vaginal pain.  Musculoskeletal: Positive for back pain.   Physical Exam   Blood pressure 110/73, pulse (!) 103, temperature 98 F (36.7  C), temperature source Oral, resp. rate 18, height 5\' 5"  (1.651 m), weight 60.3 kg, last menstrual period 10/19/2017.  Physical Exam  Nursing note and vitals reviewed. Constitutional: She is oriented to person, place, and time. She appears well-developed and well-nourished. She appears distressed (looks uncomfortable).  HENT:  Head: Normocephalic and atraumatic.  Eyes: Conjunctivae and EOM are normal. No scleral icterus.  Respiratory: No respiratory distress.  Genitourinary:    Genitourinary Comments: Sterile speculum exam  small amount of thick yellow discharge at introitus  moderate amount of thicker, mucous-like, white discharge in vault  cervix appears visually open, no increased fluid noted with valsalva  SVE 1-2/80/-1. Confirmed vertex presentation, feels smooth with sutures but no obvious hair felt.    Neurological: She is alert and oriented to person, place, and time.  Psychiatric: She has a normal mood  and affect. Her behavior is normal.    MAU Course  Procedures  MDM -- ferning negative -- wet prep without evidence of infection, G/C pending  -- Reactive tracing: 130-140s/mod/+a/-d/ irregular contractions every 5-10 minutes  -- has already received BMZ this pregnancy (10/30-31), has active prescription for nifedipine and knows how to use it  contractions have not increased since presentation   Assessment and Plan  17yo G1P0 at 1243w4d who presents with concern for ROM. No pooling, nitrazine negative. Suspect loss of mucous plug mixed with increased physiologic end of pregnancy discharge. No signs of infection. Advised wearing pads and returning if soaking through pad or thinning discharge. Reassuring fetal status. Reviewed preterm labor precautions. Advised to keep regularly scheduled appointment in clinic.    Tamera StandsLaurel S Tenita Cue, DO 06/25/2018, 3:54 PM

## 2018-06-25 NOTE — Discharge Instructions (Signed)
·   Return to MAU if you notice you are saturating a pad or having increased contractions.  Also return if you notice less baby movement or vaginal bleeding.

## 2018-06-25 NOTE — MAU Note (Signed)
LOF since this AM, has been constant, clear  Having lower back pain and ctx  No bleeding, + FM

## 2018-06-26 LAB — GC/CHLAMYDIA PROBE AMP (~~LOC~~) NOT AT ARMC
Chlamydia: NEGATIVE
Neisseria Gonorrhea: NEGATIVE

## 2018-07-05 NOTE — L&D Delivery Note (Signed)
Delivery Note At 5:00 PM a viable female was delivered via Vaginal, Spontaneous OA Presentation  APGAR: ,9 9  weight  pending .   Placenta status:normal spontaneously with 3 vessel cord , .  Cord:  with the following complications:none .  Cord pH: not obtained  Anesthesia:  none Episiotomy: none  Lacerations:none   Suture Repair: not applicable Est. Blood Loss (mL):  300  Mom to postpartum.  Baby to Couplet care / Skin to Skin.  Jacqueline Meadows 07/12/2018, 5:10 PM

## 2018-07-06 LAB — OB RESULTS CONSOLE GBS: GBS: NEGATIVE

## 2018-07-07 ENCOUNTER — Telehealth (HOSPITAL_COMMUNITY): Payer: Self-pay | Admitting: *Deleted

## 2018-07-07 NOTE — Telephone Encounter (Signed)
Preadmission screen  

## 2018-07-08 ENCOUNTER — Inpatient Hospital Stay (HOSPITAL_COMMUNITY)
Admission: AD | Admit: 2018-07-08 | Discharge: 2018-07-08 | Disposition: A | Discharge status: Home / Self Care | Payer: Managed Care, Other (non HMO) | Source: Ambulatory Visit | Attending: Obstetrics and Gynecology | Admitting: Obstetrics and Gynecology

## 2018-07-08 ENCOUNTER — Encounter (HOSPITAL_COMMUNITY): Payer: Self-pay

## 2018-07-08 DIAGNOSIS — O479 False labor, unspecified: Secondary | ICD-10-CM | POA: Diagnosis not present

## 2018-07-08 DIAGNOSIS — O99820 Streptococcus B carrier state complicating pregnancy: Secondary | ICD-10-CM

## 2018-07-08 DIAGNOSIS — Z3A37 37 weeks gestation of pregnancy: Secondary | ICD-10-CM | POA: Diagnosis not present

## 2018-07-08 DIAGNOSIS — O471 False labor at or after 37 completed weeks of gestation: Secondary | ICD-10-CM | POA: Diagnosis present

## 2018-07-08 DIAGNOSIS — Z3689 Encounter for other specified antenatal screening: Secondary | ICD-10-CM | POA: Diagnosis not present

## 2018-07-08 NOTE — MAU Provider Note (Signed)
S: Ms. Jacqueline Meadows is a 18 y.o. G1P0000 at [redacted]w[redacted]d  who presents to MAU today for labor evaluation.     Cervical exam by RN: no cervical change over 1.5 hours  Dilation: 3.5 Effacement (%): 70 Station: -1 Presentation: Vertex Exam by:: Quintella Baton, RN  Fetal Monitoring: Baseline: 120 Variability: moderate  Accelerations: present  Decelerations: none  Contractions: 2-5 minutes, mild by palpation   MDM Discussed patient with RN. NST reviewed.   A: SIUP at [redacted]w[redacted]d  False labor  P: Discharge home Labor precautions and kick counts included in AVS Patient to follow-up with [facility] as scheduled  Patient may return to MAU as needed or when in labor   Sharyon Cable, [place] 07/08/2018 10:15 PM

## 2018-07-08 NOTE — Discharge Instructions (Signed)
Reasons to return to MAU: ° °1.  Contractions are  3-4 minutes apart or less, each last 1 minute, these have been going on for 1-2 hours, and you cannot walk or talk during them °2.  You have a large gush of fluid, or a trickle of fluid that will not stop and you have to wear a pad °3.  You have bleeding that is bright red, heavier than spotting--like menstrual bleeding (spotting can be normal in early labor or after a check of your cervix) °4.  You do not feel the baby moving like he/she normally does ° °

## 2018-07-08 NOTE — MAU Note (Addendum)
Having lower abd pain since Friday night. Worse with movement. Ctxs off and on all day. Getting closer and stronger and pelvic pressure. 3.5cm on THurs. Baby small for dates. For IOL on Weds 07/12/18

## 2018-07-12 ENCOUNTER — Encounter (HOSPITAL_COMMUNITY): Payer: Self-pay

## 2018-07-12 ENCOUNTER — Other Ambulatory Visit: Payer: Self-pay

## 2018-07-12 ENCOUNTER — Inpatient Hospital Stay (HOSPITAL_COMMUNITY)
Admission: RE | Admit: 2018-07-12 | Discharge: 2018-07-14 | DRG: 807 | Disposition: A | Discharge status: Home / Self Care | Payer: Managed Care, Other (non HMO) | Attending: Obstetrics and Gynecology | Admitting: Obstetrics and Gynecology

## 2018-07-12 DIAGNOSIS — O365930 Maternal care for other known or suspected poor fetal growth, third trimester, not applicable or unspecified: Principal | ICD-10-CM | POA: Diagnosis present

## 2018-07-12 DIAGNOSIS — Z3A38 38 weeks gestation of pregnancy: Secondary | ICD-10-CM | POA: Diagnosis not present

## 2018-07-12 DIAGNOSIS — O365990 Maternal care for other known or suspected poor fetal growth, unspecified trimester, not applicable or unspecified: Secondary | ICD-10-CM | POA: Diagnosis present

## 2018-07-12 LAB — CBC
HCT: 36.1 % (ref 36.0–46.0)
Hemoglobin: 12.2 g/dL (ref 12.0–15.0)
MCH: 30.8 pg (ref 26.0–34.0)
MCHC: 33.8 g/dL (ref 30.0–36.0)
MCV: 91.2 fL (ref 80.0–100.0)
Platelets: 247 10*3/uL (ref 150–400)
RBC: 3.96 MIL/uL (ref 3.87–5.11)
RDW: 13.1 % (ref 11.5–15.5)
WBC: 13.4 10*3/uL — ABNORMAL HIGH (ref 4.0–10.5)
nRBC: 0 % (ref 0.0–0.2)

## 2018-07-12 LAB — RPR: RPR Ser Ql: NONREACTIVE

## 2018-07-12 MED ORDER — LEVOTHYROXINE SODIUM 25 MCG PO TABS
25.0000 ug | ORAL_TABLET | Freq: Every day | ORAL | Status: DC
  Administered 2018-07-13 – 2018-07-14 (×2): 25 ug via ORAL
  Filled 2018-07-12 (×2): qty 1

## 2018-07-12 MED ORDER — COCONUT OIL OIL
1.0000 "application " | TOPICAL_OIL | Status: DC | PRN
  Administered 2018-07-13: 1 via TOPICAL
  Filled 2018-07-12: qty 120

## 2018-07-12 MED ORDER — OXYCODONE-ACETAMINOPHEN 5-325 MG PO TABS: 2.0000 | ORAL_TABLET | ORAL | Status: DC | PRN

## 2018-07-12 MED ORDER — DIBUCAINE 1 % RE OINT: 1.0000 "application " | TOPICAL_OINTMENT | RECTAL | Status: DC | PRN

## 2018-07-12 MED ORDER — IBUPROFEN 600 MG PO TABS
600.0000 mg | ORAL_TABLET | Freq: Four times a day (QID) | ORAL | Status: DC
  Administered 2018-07-12 – 2018-07-14 (×6): 600 mg via ORAL
  Filled 2018-07-12 (×6): qty 1

## 2018-07-12 MED ORDER — PRENATAL MULTIVITAMIN CH
1.0000 | ORAL_TABLET | Freq: Every day | ORAL | Status: DC
  Administered 2018-07-13: 1 via ORAL
  Filled 2018-07-12: qty 1

## 2018-07-12 MED ORDER — BUTORPHANOL TARTRATE 1 MG/ML IJ SOLN
2.0000 mg | Freq: Once | INTRAMUSCULAR | Status: AC
  Administered 2018-07-12: 2 mg via INTRAVENOUS
  Filled 2018-07-12: qty 2

## 2018-07-12 MED ORDER — SENNOSIDES-DOCUSATE SODIUM 8.6-50 MG PO TABS
2.0000 | ORAL_TABLET | ORAL | Status: DC
  Administered 2018-07-12 – 2018-07-13 (×2): 2 via ORAL
  Filled 2018-07-12 (×2): qty 2

## 2018-07-12 MED ORDER — OXYCODONE-ACETAMINOPHEN 5-325 MG PO TABS: 1.0000 | ORAL_TABLET | ORAL | Status: DC | PRN

## 2018-07-12 MED ORDER — TERBUTALINE SULFATE 1 MG/ML IJ SOLN
0.2500 mg | Freq: Once | INTRAMUSCULAR | Status: DC | PRN
  Filled 2018-07-12: qty 1

## 2018-07-12 MED ORDER — LIDOCAINE HCL (PF) 1 % IJ SOLN
30.0000 mL | INTRAMUSCULAR | Status: DC | PRN
  Filled 2018-07-12: qty 30

## 2018-07-12 MED ORDER — ACETAMINOPHEN 325 MG PO TABS
650.0000 mg | ORAL_TABLET | ORAL | Status: DC | PRN
  Administered 2018-07-12: 650 mg via ORAL
  Filled 2018-07-12: qty 2

## 2018-07-12 MED ORDER — BUTORPHANOL TARTRATE 1 MG/ML IJ SOLN
1.0000 mg | Freq: Once | INTRAMUSCULAR | Status: AC
  Administered 2018-07-12: 1 mg via INTRAVENOUS

## 2018-07-12 MED ORDER — OXYTOCIN 40 UNITS IN NORMAL SALINE INFUSION - SIMPLE MED: 2.5000 [IU]/h | INTRAVENOUS | Status: DC

## 2018-07-12 MED ORDER — MEASLES, MUMPS & RUBELLA VAC IJ SOLR: 0.5000 mL | Freq: Once | INTRAMUSCULAR | Status: DC

## 2018-07-12 MED ORDER — ONDANSETRON HCL 4 MG/2ML IJ SOLN: 4.0000 mg | Freq: Four times a day (QID) | INTRAMUSCULAR | Status: DC | PRN

## 2018-07-12 MED ORDER — OXYTOCIN BOLUS FROM INFUSION: 500.0000 mL | Freq: Once | INTRAVENOUS | Status: DC

## 2018-07-12 MED ORDER — ZOLPIDEM TARTRATE 5 MG PO TABS: 5.0000 mg | ORAL_TABLET | Freq: Every evening | ORAL | Status: DC | PRN

## 2018-07-12 MED ORDER — DIPHENHYDRAMINE HCL 25 MG PO CAPS: 25.0000 mg | ORAL_CAPSULE | Freq: Four times a day (QID) | ORAL | Status: DC | PRN

## 2018-07-12 MED ORDER — BENZOCAINE-MENTHOL 20-0.5 % EX AERO: 1.0000 "application " | INHALATION_SPRAY | CUTANEOUS | Status: DC | PRN

## 2018-07-12 MED ORDER — LACTATED RINGERS IV SOLN
INTRAVENOUS | Status: DC
  Administered 2018-07-12: 09:00:00 via INTRAVENOUS

## 2018-07-12 MED ORDER — WITCH HAZEL-GLYCERIN EX PADS: 1.0000 "application " | MEDICATED_PAD | CUTANEOUS | Status: DC | PRN

## 2018-07-12 MED ORDER — ONDANSETRON HCL 4 MG PO TABS: 4.0000 mg | ORAL_TABLET | ORAL | Status: DC | PRN

## 2018-07-12 MED ORDER — ONDANSETRON HCL 4 MG/2ML IJ SOLN: 4.0000 mg | INTRAMUSCULAR | Status: DC | PRN

## 2018-07-12 MED ORDER — LACTATED RINGERS IV SOLN: 500.0000 mL | INTRAVENOUS | Status: DC | PRN

## 2018-07-12 MED ORDER — TETANUS-DIPHTH-ACELL PERTUSSIS 5-2.5-18.5 LF-MCG/0.5 IM SUSP: 0.5000 mL | Freq: Once | INTRAMUSCULAR | Status: DC

## 2018-07-12 MED ORDER — SIMETHICONE 80 MG PO CHEW: 80.0000 mg | CHEWABLE_TABLET | ORAL | Status: DC | PRN

## 2018-07-12 MED ORDER — MEDROXYPROGESTERONE ACETATE 150 MG/ML IM SUSP: 150.0000 mg | INTRAMUSCULAR | Status: DC | PRN

## 2018-07-12 MED ORDER — ACETAMINOPHEN 325 MG PO TABS: 650.0000 mg | ORAL_TABLET | ORAL | Status: DC | PRN

## 2018-07-12 MED ORDER — SOD CITRATE-CITRIC ACID 500-334 MG/5ML PO SOLN: 30.0000 mL | ORAL | Status: DC | PRN

## 2018-07-12 MED ORDER — BUTORPHANOL TARTRATE 1 MG/ML IJ SOLN
INTRAMUSCULAR | Status: AC
  Filled 2018-07-12: qty 1

## 2018-07-12 MED ORDER — OXYTOCIN 40 UNITS IN NORMAL SALINE INFUSION - SIMPLE MED
1.0000 m[IU]/min | INTRAVENOUS | Status: DC
  Administered 2018-07-12: 2 m[IU]/min via INTRAVENOUS
  Filled 2018-07-12: qty 1000

## 2018-07-12 MED ORDER — BISACODYL 10 MG RE SUPP: 10.0000 mg | Freq: Every day | RECTAL | Status: DC | PRN

## 2018-07-12 MED ORDER — FLEET ENEMA 7-19 GM/118ML RE ENEM: 1.0000 | ENEMA | Freq: Every day | RECTAL | Status: DC | PRN

## 2018-07-12 NOTE — Progress Notes (Signed)
Per Dr Vincente PoliGrewal :  Begin pitocin @ 2 milliunits and increase by 2 milliunits per protocol.  Pt may have epidural upon request.

## 2018-07-12 NOTE — Progress Notes (Signed)
Pt up to BR: will obtain BP once patient back in bed

## 2018-07-12 NOTE — Progress Notes (Signed)
Dr Vincente Poli going back to office for a few min.

## 2018-07-12 NOTE — Progress Notes (Signed)
Phoned pharmacy and verified stadol would be ok to give due to allergy to vicodin.  Pharmacy stated stadol will be fine to give.

## 2018-07-12 NOTE — Lactation Note (Signed)
This note was copied from a baby's chart. Lactation Consultation Note  Patient Name: Jacqueline Meadows [id number]'s Date: 07/12/2018 Reason for consult: Initial assessment;1st time breastfeeding;Early term 37-38.6wks;Infant < 6lbs P1, 5 hour female infant, SGA and less than 6 lbs. Per mom[facility] [place] and has an assigned Breastfeeding International aid/development workereer Counselor. Mom's feeding choice upon admission was breastfeeding and supplementing with formula. Mom demonstrated hand expression and easily expressed 7 ml of colostrum. Mom latched infant to left  breast using football hold after few attempts infant sustained latch with 20 mm NS, swallows observed with colostrum in NS. Infant breastfeed for 12 minutes. Infant received 7 ml of EBM by curve tip syringe. Mom was doing STS as LC left room and plans to pump afterwards for 15 mins. with DEBP. Mom shown how to use DEBP & how to disassemble, clean, & reassemble parts. LC discussed I & O. Mom knows to breastfeeding according hunger cues and not exceed 3 hours without breastfeeding infant. Mom made aware of O/P services, breastfeeding support groups, community resources, and our phone # for post-discharge questions.  BF plans: 1. Mom will breastfeed according hunger cues and not exceed 3 hours without breastfeeding infant. 2. After latching infant to breast mom will supplement with EBM or 22kcal formula due infant being SGA. 3. LC discussed supplemental guidelines with mom due infant's SGA mom is preferring to supplement with EBM and then give formula if breast milk supplementation is not enough based on infant's age / hours of life.  4. Mom will hand express or use DEBP, Mom has been explained how to use and knows to pump every 3 hours on initial setting for 15 minutes.] 5. Mom will continue to do as much STS as possible.    Maternal Data Formula Feeding for Exclusion: Yes Reason for exclusion: Mother's choice to formula and breast feed  on admission Has patient been taught Hand Expression?: Yes(Mom easily hand expressed 7 ml of colostrum that was given by curve tip syringe.) Does the patient have breastfeeding experience prior to this delivery?: No  Feeding Feeding Type: Breast Fed  LATCH Score Latch: Repeated attempts needed to sustain latch, nipple held in mouth throughout feeding, stimulation needed to elicit sucking reflex.  Audible Swallowing: A few with stimulation  Type of Nipple: Everted at rest and after stimulation  Comfort (Breast/Nipple): Soft / non-tender  Hold (Positioning): Assistance needed to correctly position infant at breast and maintain latch.  LATCH Score: 7  Interventions Interventions: Breast feeding basics reviewed;Assisted with latch;Skin to skin;Breast massage;Hand express;Support pillows;Adjust position;Breast compression;Expressed milk;DEBP  Lactation Tools Discussed/Used Tools: Nipple Shields Nipple shield size: 20 WIC Program: Yes Pump Review: Setup, frequency, and cleaning;Milk Storage Initiated by:: Jacqueline Meadows, IBCLC Date initiated:: 07/12/18   Consult Status Consult Status: Follow-up Date: 07/13/18 Follow-up type: In-patient    Jacqueline EarthlyRobin Skii Meadows 07/12/2018, 10:40 PM

## 2018-07-12 NOTE — H&P (Signed)
Jacqueline Meadows is a 18 y.o. G 1 P 0 at 38 weeks presents for inductions for IUGR. EFW less than 4%.  OB History    Gravida  1   Para  0   Term  0   Preterm  0   AB  0   Living  0     SAB  0   TAB  0   Ectopic  0   Multiple  0   Live Births  0          Past Medical History:  Diagnosis Date  . Anxiety   . Asthma   . Heart murmur    resolved by age 70/18y/o.  [name] Kitchen Hyperthyroidism   . Migraine   . Premature baby    7 weeks early   Past Surgical History:  Procedure Laterality Date  . FOOT SURGERY Bilateral 03-2015   Rods in both feet   Family History: family history includes ADD / ADHD in her brother and mother; Anxiety disorder in her brother and mother; Bipolar disorder in her mother and paternal aunt; Depression in her brother and mother; Migraines in her mother; Other in her paternal aunt; Ovarian cancer in her maternal aunt; Suicidality in her paternal aunt. Social History:  reports that she has never smoked. She has never used smokeless tobacco. She reports that she does not drink alcohol or use drugs.     Maternal Diabetes: No Genetic Screening: Normal Maternal Ultrasounds/Referrals: Normal Fetal Ultrasounds or other Referrals:  None Maternal Substance Abuse:  No Significant Maternal Medications:  None Significant Maternal Lab Results:  None Other Comments:  None  Review of Systems  All other systems reviewed and are negative.  Maternal Medical History:  Fetal activity: Perceived fetal activity is normal.    Prenatal complications: IUGR and preterm labor.     Dilation: (P) 4.5 Effacement (%): (P) 90 Station: (P) -1 Exam by:: (P) DR Vincente Poli  Last menstrual period 10/19/2017. Maternal Exam:  Uterine Assessment: Contraction strength is moderate.  Contraction frequency is regular.   Abdomen: Fetal presentation: vertex  Introitus: Normal vulva. Normal vagina.  Ferning test: not done.  Nitrazine test: not done. Amniotic fluid character:  clear.     Fetal Exam Fetal State Assessment: Category I - tracings are normal.     Physical Exam  Nursing note and vitals reviewed. Constitutional: She appears well-developed.  HENT:  Head: Normocephalic.  Eyes: Pupils are equal, round, and reactive to light.  Cardiovascular: Normal rate.  Respiratory: Effort normal.  GI: Soft.  Genitourinary:    Vulva and vagina normal.     Prenatal labs: ABO, Rh: --/--/A NEG (11/25 1505) Antibody: POS (11/25 1505) Rubella: Nonimmune (06/13 0000) RPR: Nonreactive (06/13 0000)  HBsAg: Negative (06/13 0000)  HIV: Non-reactive (06/13 0000)  GBS: Negative (01/02 0000)   Assessment/Plan: IUP at 38 weeks IUGR Pitocin prn Epidural prn  Jacqueline Meadows 07/12/2018, 7:43 AM

## 2018-07-12 NOTE — Anesthesia Pain Management Evaluation Note (Signed)
  CRNA Pain Management Visit Note  Patient: Jacqueline Meadows, 18 y.o., female  "Hello I am a member of the anesthesia team at [facility]. We have an anesthesia team available at all times to provide care throughout the hospital, including epidural management and anesthesia for C-section. I don't know your plan for the delivery whether it a natural birth, water birth, IV sedation, nitrous supplementation, doula or epidural, but we want to meet your pain goals."   1.Was your pain managed to your expectations on prior hospitalizations?   No prior hospitalizations  2.What is your expectation for pain management during this hospitalization?     IV pain meds  3.How can we help you reach that goal? IV pain meds as desired.   Record the patient's initial score and the patient's pain goal.   Pain: 2  Pain Goal: 8 [facility] wants you to be able to say your pain was always managed very well.  Adalid Beckmann 07/12/2018

## 2018-07-13 LAB — CBC
HCT: 30.6 % — ABNORMAL LOW (ref 36.0–46.0)
Hemoglobin: 10.4 g/dL — ABNORMAL LOW (ref 12.0–15.0)
MCH: 31.1 pg (ref 26.0–34.0)
MCHC: 34 g/dL (ref 30.0–36.0)
MCV: 91.6 fL (ref 80.0–100.0)
Platelets: 228 10*3/uL (ref 150–400)
RBC: 3.34 MIL/uL — ABNORMAL LOW (ref 3.87–5.11)
RDW: 13.5 % (ref 11.5–15.5)
WBC: 15 10*3/uL — ABNORMAL HIGH (ref 4.0–10.5)
nRBC: 0 % (ref 0.0–0.2)

## 2018-07-13 LAB — RUBELLA SCREEN

## 2018-07-13 MED ORDER — MEASLES, MUMPS & RUBELLA VAC IJ SOLR: 0.5000 mL | Freq: Once | INTRAMUSCULAR | Status: DC

## 2018-07-13 NOTE — Progress Notes (Signed)
Post Partum Day 1 Subjective: no complaints, up ad lib, voiding and tolerating PO  Objective: Blood pressure 120/78, pulse 80, temperature 97.8 F (36.6 C), temperature source Oral, resp. rate 18, height 5\' 5"  (1.651 m), weight 60.6 kg, last menstrual period 10/19/2017, SpO2 100 %, unknown if currently breastfeeding.  Physical Exam:  General: alert, cooperative, appears stated age and no distress Lochia: appropriate Uterine Fundus: firm Incision: healing well DVT Evaluation: No evidence of DVT seen on physical exam.  Recent Labs    07/12/18 0738 07/13/18 0549  HGB 12.2 10.4*  HCT 36.1 30.6*    Assessment/Plan: Plan for discharge tomorrow and Breastfeeding   LOS: 1 day   Jacqueline Meadows 07/13/2018, 8:57 AM

## 2018-07-13 NOTE — Progress Notes (Signed)
MOB was referred for history of depression/anxiety. * Referral screened out by Clinical Social Worker because none of the following criteria appear to apply: ~ History of anxiety/depression during this pregnancy, or of post-partum depression following prior delivery. ~ Diagnosis of anxiety and/or depression within last 3 years. No concerns noted in OB record. OR * MOB's symptoms currently being treated with medication and/or therapy.  Please contact the Clinical Social Worker if needs arise, by MOB request, or if MOB scores greater than 9/yes to question 10 on Edinburgh Postpartum Depression Screen.  Geet Hosking Boyd-Gilyard, MSW, LCSW Clinical Social Work [phone]   

## 2018-07-13 NOTE — Lactation Note (Signed)
This note was copied from a baby's chart. Lactation Consultation Note  Patient Name: Jacqueline Meadows [id number] Date: 07/13/2018 Reason for consult: Follow-up assessment;Primapara;Early term 37-38.6wks;Infant < 6lbs  Visited with P1, 18 yo Mom of ET infan,t weighing 5-4. Baby at 1% weight loss at 21 hrs old.  Mom has been breastfeeding, hand expressing colostrum.    Baby waking up, and offered to assist with positioning and latching baby.  Stripped baby down to STS, with explanation on why this is important.  Assisted Mom with latching baby on right breast in football hold.  Tried without the nipple shield, as nipples are erect and areola is compressible.  Baby opens, but quickly slips onto nipple.  Initiated a 20 mm nipple shield.  Baby still tends to slip too shallow, but with assistance in sandwiching breast, baby able to maintain a deeper latch.  Mom had EBM from earlier.  Fed baby 5 ml while on the breast, using a curved tip syringe . Assisted Mom to double pump.  Mom expressed 13 ml colostrum.  To feed this back to baby at next feeding.  Talked about offering a bottle by pace bottle feeding.  Demonstrated this to Mom and GMOB.    Plan- 1- Keep baby STS as much as possible 2- Breastfeed baby at least every 3 hrs, awakening baby well for feeding. 3- Feed baby any EBM from previous pumping, by curved tip syringe or pace bottle 4- Pump both breasts 15-20 mins, adding breast massage and hand expression.    Faxed referral to [facility] for pump.   Feeding Feeding Type: Breast Fed  LATCH Score Latch: Grasps breast easily, tongue down, lips flanged, rhythmical sucking.  Audible Swallowing: A few with stimulation  Type of Nipple: Everted at rest and after stimulation  Comfort (Breast/Nipple): Soft / non-tender  Hold (Positioning): Assistance needed to correctly position infant at breast and maintain latch.  LATCH Score: 8  Interventions Interventions: Breast feeding basics  reviewed;Assisted with latch;Skin to skin;Breast massage;Hand express;Breast compression;Adjust position;Support pillows;Position options;Expressed milk;DEBP  Lactation Tools Discussed/Used Tools: Pump;Nipple Shields Nipple shield size: 20 Breast pump type: Double-Electric Breast Pump   Consult Status Consult Status: Follow-up Date: 07/14/18 Follow-up type: In-patient    Jacqueline Meadows 07/13/2018, 2:52 PM

## 2018-07-14 ENCOUNTER — Ambulatory Visit: Payer: Self-pay

## 2018-07-14 LAB — TYPE AND SCREEN
ABO/RH(D): A NEG
Antibody Screen: POSITIVE
Unit division: 0
Unit division: 0

## 2018-07-14 LAB — BPAM RBC
BLOOD PRODUCT EXPIRATION DATE: 202001312359
Blood Product Expiration Date: 202001312359
UNIT TYPE AND RH: 9500
Unit Type and Rh: 9500

## 2018-07-14 NOTE — Lactation Note (Addendum)
This note was copied from a baby's chart. Lactation Consultation Note: infant 12 hours old and is SGA and under 6 lbs, infant 5- 2 and 3% weight loss.  Mother reports that breast feeding [place] going well. Mother reports that she sees Peds in a.m. Mother denies having discomfort with feedings. Observed that mothers nipples are slightly pink .  Mother reports that she is sometimes using a #20 nipple shield. Mother reports that infant will latch on the bare breast without the shield.  Mother was given a hand pump with instructions to use after feedings for 15 mins on each breast.  Mother reports that she has an electric pump at home. Mother is active with WIC. She plans to phone [facility] for electric pump.  Mother declines to rent [facility] loaner at this time.  Encouraged mother to offer ebm after each feeding.  Mother to post pump with DEBP before discharge.  Advised to continue to breastfeed 8-12 times or more in 24 hours.  Mother to allow for cluster feeding.  Advised mother to follow up with [facility] services for pre and post weight assessment.  Mother encouraged to do good breast massage and ice for treatment and prevention of engorgement. Pt will receive a call for LC follow up.;  Patient Name: Jacqueline Meadows Today's Date: 07/14/2018 Reason for consult: Follow-up assessment   Maternal Data    Feeding Feeding Type: Breast Fed  LATCH Score                   Interventions Interventions: Breast massage;Hand express;Breast compression;Hand pump  Lactation Tools Discussed/Used     Consult Status Consult Status: Complete    Michel Bickers 07/14/2018, 11:55 AM

## 2018-07-14 NOTE — Discharge Summary (Signed)
Obstetric Discharge Summary Reason for Admission: onset of labor Prenatal Procedures: ultrasound Intrapartum Procedures: spontaneous vaginal delivery Postpartum Procedures: none Complications-Operative and Postpartum: none Hemoglobin  Date Value Ref Range Status  07/13/2018 10.4 (L) 12.0 - 15.0 g/dL Final  16/10/960406/13/2019 54.014.6  Final   HCT  Date Value Ref Range Status  07/13/2018 30.6 (L) 36.0 - 46.0 % Final  12/15/2017 42 (A) 29 - 41 Final    Physical Exam:  General: alert and cooperative Lochia: appropriate Uterine Fundus: firm Incision: healing well, no significant drainage DVT Evaluation: No evidence of DVT seen on physical exam.  Discharge Diagnoses: Term Pregnancy-delivered  Discharge Information: Date: 07/14/2018 Activity: pelvic rest Diet: routine Medications: PNV and Ibuprofen Condition: stable Instructions: refer to practice specific booklet Discharge to: home Follow-up Information    [place] Schedule an appointment as soon as possible for a visit in 6 week(s).   Contact information: [address] [place] [address] [phone]           Newborn Data: Live born female  Birth Weight: 5 lb 4.1 oz (2384 g) APGAR: 9, 9  Newborn Delivery   Birth date/time:  07/12/2018 17:00:00 Delivery type:  Vaginal, Spontaneous     Home with mother.  Zelphia CairoGretchen Pieper Kasik 07/14/2018, 8:22 AM

## 2019-05-03 LAB — OB RESULTS CONSOLE RPR: RPR: NONREACTIVE

## 2019-05-03 LAB — OB RESULTS CONSOLE ABO/RH: RH Type: NEGATIVE

## 2019-05-03 LAB — OB RESULTS CONSOLE HIV ANTIBODY (ROUTINE TESTING): HIV: NONREACTIVE

## 2019-05-03 LAB — OB RESULTS CONSOLE RUBELLA ANTIBODY, IGM: Rubella: NON-IMMUNE/NOT IMMUNE

## 2019-05-03 LAB — OB RESULTS CONSOLE GC/CHLAMYDIA
Chlamydia: NEGATIVE
Gonorrhea: NEGATIVE

## 2019-05-03 LAB — OB RESULTS CONSOLE ANTIBODY SCREEN: Antibody Screen: NEGATIVE

## 2019-05-03 LAB — OB RESULTS CONSOLE HEPATITIS B SURFACE ANTIGEN: Hepatitis B Surface Ag: NEGATIVE

## 2019-07-06 NOTE — L&D Delivery Note (Signed)
Delivery Note At 6:26 AM a viable female was delivered via Vaginal, Spontaneous (Presentation:   Occiput Anterior).  APGAR: 8, 9; weight pending.   Placenta status: Spontaneous, Intact.  Cord: 3 vessels with the following complications: None.  Cord pH: n/a  Anesthesia: None Episiotomy: None Lacerations: None Suture Repair: n/a Est. Blood Loss (mL): 350  Mom to postpartum.  Baby to Couplet care / Skin to Skin.  Mitchel Honour 11/28/2019, 6:45 AM

## 2019-10-09 ENCOUNTER — Encounter (HOSPITAL_COMMUNITY): Payer: Self-pay | Admitting: Obstetrics and Gynecology

## 2019-10-09 ENCOUNTER — Inpatient Hospital Stay (HOSPITAL_COMMUNITY)
Admission: AD | Admit: 2019-10-09 | Discharge: 2019-10-09 | Disposition: A | Discharge status: Home / Self Care | Payer: Managed Care, Other (non HMO) | Attending: Obstetrics and Gynecology | Admitting: Obstetrics and Gynecology

## 2019-10-09 ENCOUNTER — Other Ambulatory Visit: Payer: Self-pay

## 2019-10-09 DIAGNOSIS — Z79899 Other long term (current) drug therapy: Secondary | ICD-10-CM | POA: Diagnosis not present

## 2019-10-09 DIAGNOSIS — E0590 Thyrotoxicosis, unspecified without thyrotoxic crisis or storm: Secondary | ICD-10-CM | POA: Insufficient documentation

## 2019-10-09 DIAGNOSIS — Z3A31 31 weeks gestation of pregnancy: Secondary | ICD-10-CM | POA: Diagnosis not present

## 2019-10-09 DIAGNOSIS — O99283 Endocrine, nutritional and metabolic diseases complicating pregnancy, third trimester: Secondary | ICD-10-CM | POA: Diagnosis not present

## 2019-10-09 DIAGNOSIS — O99513 Diseases of the respiratory system complicating pregnancy, third trimester: Secondary | ICD-10-CM | POA: Diagnosis not present

## 2019-10-09 DIAGNOSIS — O4703 False labor before 37 completed weeks of gestation, third trimester: Secondary | ICD-10-CM | POA: Diagnosis present

## 2019-10-09 DIAGNOSIS — J45909 Unspecified asthma, uncomplicated: Secondary | ICD-10-CM | POA: Insufficient documentation

## 2019-10-09 DIAGNOSIS — O479 False labor, unspecified: Secondary | ICD-10-CM

## 2019-10-09 LAB — URINALYSIS, ROUTINE W REFLEX MICROSCOPIC
Bilirubin Urine: NEGATIVE
Glucose, UA: NEGATIVE mg/dL
Hgb urine dipstick: NEGATIVE
Ketones, ur: NEGATIVE mg/dL
Nitrite: NEGATIVE
Protein, ur: NEGATIVE mg/dL
Specific Gravity, Urine: 1.012 (ref 1.005–1.030)
pH: 6 (ref 5.0–8.0)

## 2019-10-09 NOTE — MAU Note (Signed)
PT SAYS HAVING [facility]-. PNC WITH  DR MORRIS. LAST SEX- NOT RECENTLY.

## 2019-10-09 NOTE — Discharge Instructions (Signed)
Braxton Hicks Contractions °Contractions of the uterus can occur throughout pregnancy, but they are not always a sign that you are in labor. You may have practice contractions called Braxton Hicks contractions. These false labor contractions are sometimes confused with true labor. °What are Braxton Hicks contractions? °Braxton Hicks contractions are tightening movements that occur in the muscles of the uterus before labor. Unlike true labor contractions, these contractions do not result in opening (dilation) and thinning of the cervix. Toward the end of pregnancy (32-34 weeks), Braxton Hicks contractions can happen more often and may become stronger. These contractions are sometimes difficult to tell apart from true labor because they can be very uncomfortable. You should not feel embarrassed if you go to the hospital with false labor. °Sometimes, the only way to tell if you are in true labor is for your health care provider to look for changes in the cervix. The health care provider will do a physical exam and may monitor your contractions. If you are not in true labor, the exam should show that your cervix is not dilating and your water has not broken. °If there are no other health problems associated with your pregnancy, it is completely safe for you to be sent home with false labor. You may continue to have Braxton Hicks contractions until you go into true labor. °How to tell the difference between true labor and false labor °True labor °· Contractions last 30-70 seconds. °· Contractions become very regular. °· Discomfort is usually felt in the top of the uterus, and it spreads to the lower abdomen and low back. °· Contractions do not go away with walking. °· Contractions usually become more intense and increase in frequency. °· The cervix dilates and gets thinner. °False labor °· Contractions are usually shorter and not as strong as true labor contractions. °· Contractions are usually irregular. °· Contractions  are often felt in the front of the lower abdomen and in the groin. °· Contractions may go away when you walk around or change positions while lying down. °· Contractions get weaker and are shorter-lasting as time goes on. °· The cervix usually does not dilate or become thin. °Follow these instructions at home: ° °· Take over-the-counter and prescription medicines only as told by your health care provider. °· Keep up with your usual exercises and follow other instructions from your health care provider. °· Eat and drink lightly if you think you are going into labor. °· If Braxton Hicks contractions are making you uncomfortable: °? Change your position from lying down or resting to walking, or change from walking to resting. °? Sit and rest in a tub of warm water. °? Drink enough fluid to keep your urine pale yellow. Dehydration may cause these contractions. °? Do slow and deep breathing several times an hour. °· Keep all follow-up prenatal visits as told by your health care provider. This is important. °Contact a health care provider if: °· You have a fever. °· You have continuous pain in your abdomen. °Get help right away if: °· Your contractions become stronger, more regular, and closer together. °· You have fluid leaking or gushing from your vagina. °· You pass blood-tinged mucus (bloody show). °· You have bleeding from your vagina. °· You have low back pain that you never had before. °· You feel your baby’s head pushing down and causing pelvic pressure. °· Your baby is not moving inside you as much as it used to. °Summary °· Contractions that occur before labor are   called Braxton Hicks contractions, false labor, or practice contractions. °· Braxton Hicks contractions are usually shorter, weaker, farther apart, and less regular than true labor contractions. True labor contractions usually become progressively stronger and regular, and they become more frequent. °· Manage discomfort from Braxton Hicks contractions  by changing position, resting in a warm bath, drinking plenty of water, or practicing deep breathing. °This information is not intended to replace advice given to you by your health care provider. Make sure you discuss any questions you have with your health care provider. °Document Revised: 06/03/2017 Document Reviewed: 11/04/2016 °Elsevier Patient Education © 2020 Elsevier Inc. ° °

## 2019-10-09 NOTE — MAU Note (Signed)
Pt reports contractions every 4-5 minutes. She denies LOF or vaginal bleeding. Reports good fetal movement. Has a history of PTL with previous pregnancy.

## 2019-10-09 NOTE — MAU Provider Note (Signed)
History     [id number]  Arrival date and time: 10/09/19 0007    Chief Complaint  Patient presents with  . Contractions     HPI Jacqueline Meadows is a 19 y.o. at [redacted]w[redacted]d by 12wk [place] with PMHx notable for hypothyroidism, asthma, Rh neg, preterm labor in prior pregnancy w subsequent full term delivery, who presents for contractions.   Has felt contractions since around 2100 Have been getting closer together from hourly to 10 min to 3 min No loss of fluid or vaginal bleeding Endorses fetal movement Denies fevers, n/v, burning or pain w urination, vaginal discharge Reports OB has checked cervix during pregnancy and was told she was closed and long During last pregnancy around 6 months dilated to 1cm and had preterm contractions but did not dilate further, was then induced two weeks early but still at term     OB History    Gravida  2   Para  1   Term  1   Preterm  0   AB  0   Living  1     SAB  0   TAB  0   Ectopic  0   Multiple  0   Live Births  1           Past Medical History:  Diagnosis Date  . Anxiety   . Asthma   . Heart murmur    resolved by age [age]y/o.  [name] Kitchen Hyperthyroidism   . Migraine   . Premature baby    7 weeks early    Past Surgical History:  Procedure Laterality Date  . FOOT SURGERY Bilateral 03-2015   Rods in both feet    Family History  Problem Relation Age of Onset  . Anxiety disorder Mother   . Depression Mother   . ADD / ADHD Mother   . Bipolar disorder Mother   . Migraines Mother   . Anxiety disorder Brother   . Depression Brother   . Bipolar disorder Paternal Aunt        3 paternal aunts bipolar dx and on disability  . Suicidality Paternal Aunt        paternal aunt attempted suicide 2 times  . Other Paternal Aunt        Hx of substance abuse  . ADD / ADHD Brother   . Ovarian cancer Maternal Aunt     Social History   Socioeconomic History  . Marital status: Single    Spouse name: Not on file  . Number of  children: Not on file  . Years of education: Not on file  . Highest education level: Not on file  Occupational History  . Occupation: Lobbyist: [employer]  Tobacco Use  . Smoking status: Never Smoker  . Smokeless tobacco: Never Used  Substance and Sexual Activity  . Alcohol use: No  . Drug use: No  . Sexual activity: Not Currently    Birth control/protection: None  Other Topics Concern  . Not on file  Social History Narrative   Jacqueline Meadows attends eleventh grade at [employer]. She is doing well.   Lives with her parents and siblings.   Mother and 2 paternal aunts have bipolar disorder. Aunts have been hospitalized for mental illness and one aunt was hospitalized for substance abuse.    HC: 53.6 cm   PHQ-9 Total Score: 4   Social Determinants of Health   Financial Resource Strain:   . Difficulty of Paying  Living Expenses:   Food Insecurity:   . Worried About Programme researcher, broadcasting/film/video in the Last Year:   . Barista in the Last Year:   Transportation Needs:   . Freight forwarder (Medical):   [name] Kitchen Lack of Transportation (Non-Medical):   Physical Activity:   . Days of Exercise per Week:   . Minutes of Exercise per Session:   Stress:   . Feeling of Stress :   Social Connections:   . Frequency of Communication with Friends and Family:   . Frequency of Social Gatherings with Friends and Family:   . Attends Religious Services:   . Active Member of Clubs or Organizations:   . Attends Banker Meetings:   [name] Kitchen Marital Status:   Intimate Partner Violence:   . Fear of Current or Ex-Partner:   . Emotionally Abused:   [name] Kitchen Physically Abused:   . Sexually Abused:     Allergies  Allergen Reactions  . Vicodin [Hydrocodone-Acetaminophen] Rash    Pt states she can take Oxycodone    No current facility-administered medications on file prior to encounter.   Current Outpatient Medications on File Prior to Encounter  Medication Sig Dispense Refill   . levothyroxine (SYNTHROID, LEVOTHROID) 25 MCG tablet Take 25 mcg by mouth daily before breakfast.    . Prenatal Vit-Fe Fumarate-FA (PRENATAL MULTIVITAMIN) TABS tablet Take 1 tablet by mouth daily at 12 noon.       ROS Pertinent positives and negative per HPI, all others reviewed and negative  Physical Exam   BP 120/69 (BP Location: Right Arm)   Pulse 81   Temp 98.8 F (37.1 C) (Oral)   Resp 16   Ht 5\' 5"  (1.651 m)   Wt 60.9 kg   BMI 22.35 kg/m   Physical Exam  Vitals reviewed. Constitutional: She appears well-developed and well-nourished. No distress.  Eyes: No scleral icterus.  Respiratory: Effort normal. No respiratory distress.  GI: Soft. She exhibits no distension. There is no abdominal tenderness. There is no rebound and no guarding.  Musculoskeletal:        General: No edema.  Neurological: She is alert. Coordination normal.  Skin: Skin is warm and dry. She is not diaphoretic.  Psychiatric: She has a normal mood and affect.    Cervical Exam Dilation: 1 Effacement (%): Thick Station: 0 Presentation: Vertex Exam by:: Dr. [email or web address]  Bedside Ultrasound Deferred  FHT Baseline 130, moderate variability, +accels, -decels Toco: irritability Cat I  Labs Results for orders placed or performed during the hospital encounter of 10/09/19 (from the past 24 hour(s))  Urinalysis, Routine w reflex microscopic     Status: Abnormal   Collection Time: 10/09/19 12:46 AM  Result Value Ref Range   Color, Urine YELLOW YELLOW   APPearance CLEAR CLEAR   Specific Gravity, Urine 1.012 1.005 - 1.030   pH 6.0 5.0 - 8.0   Glucose, UA NEGATIVE NEGATIVE mg/dL   Hgb urine dipstick NEGATIVE NEGATIVE   Bilirubin Urine NEGATIVE NEGATIVE   Ketones, ur NEGATIVE NEGATIVE mg/dL   Protein, ur NEGATIVE NEGATIVE mg/dL   Nitrite NEGATIVE NEGATIVE   Leukocytes,Ua SMALL (A) NEGATIVE   RBC / HPF 0-5 0 - 5 RBC/hpf   WBC, UA 0-5 0 - 5 WBC/hpf   Bacteria, UA RARE (A) NONE SEEN   Squamous  Epithelial / LPF 6-10 0 - 5   Mucus PRESENT     Imaging No results found.  MAU Course  Procedures  Lab Orders  Urinalysis, Routine w reflex microscopic No orders of the defined types were placed in this encounter.    MDM Low  Assessment and Plan  #False labor Patient presenting w report of contractions. On toco mostly irritability with rare contractions, NST reactive. On serial cervical exams cervix unchanged at 1cm and patient is comfortable appearing. Reviewed labor precautions, has follow up in AM w primary OB.   #FWB FHT Cat I NST Reactive  Clarnce Flock

## 2019-10-09 NOTE — MAU Note (Signed)
Pt may come off cardio, but remain on toco per Dr. Crissie Reese

## 2019-11-10 ENCOUNTER — Other Ambulatory Visit: Payer: Self-pay

## 2019-11-10 ENCOUNTER — Inpatient Hospital Stay (HOSPITAL_COMMUNITY)
Admission: AD | Admit: 2019-11-10 | Discharge: 2019-11-10 | Disposition: A | Discharge status: Home / Self Care | Payer: Managed Care, Other (non HMO) | Source: Ambulatory Visit | Attending: Obstetrics and Gynecology | Admitting: Obstetrics and Gynecology

## 2019-11-10 DIAGNOSIS — Z3A00 Weeks of gestation of pregnancy not specified: Secondary | ICD-10-CM | POA: Insufficient documentation

## 2019-11-10 DIAGNOSIS — O479 False labor, unspecified: Secondary | ICD-10-CM | POA: Insufficient documentation

## 2019-11-10 LAB — URINALYSIS, ROUTINE W REFLEX MICROSCOPIC
Bilirubin Urine: NEGATIVE
Glucose, UA: NEGATIVE mg/dL
Hgb urine dipstick: NEGATIVE
Ketones, ur: NEGATIVE mg/dL
Leukocytes,Ua: NEGATIVE
Nitrite: NEGATIVE
Protein, ur: NEGATIVE mg/dL
Specific Gravity, Urine: 1.011 (ref 1.005–1.030)
pH: 6 (ref 5.0–8.0)

## 2019-11-10 NOTE — MAU Note (Signed)
.   Jacqueline Meadows is a 19 y.o. at [redacted]w[redacted]d here in MAU reporting: ctx since 1700. Pt reports she took a dose of procardia with no relief. Pt states the ctx are every 3-6 min apart. No bleeding or LOF. +FM.    Pain score: 8/10 Vitals:   11/10/19 2122  BP: 124/75  Pulse: 88  Resp: 16  Temp: 98.3 F (36.8 C)      Lab orders placed from triage:  Urinalysis

## 2019-11-10 NOTE — Progress Notes (Signed)
I have communicated with Jacqueline Meadows, cnm and reviewed vital signs:  Vitals:   11/10/19 2122 11/10/19 2212  BP: 124/75 112/64  Pulse: 88 82  Resp: 16   Temp: 98.3 F (36.8 C)     Vaginal exam:  Dilation: 1 Effacement (%): Thick Cervical Position: Posterior Presentation: Vertex Exam by:: Karl Ito, rnc ,   Also reviewed contraction pattern and that non-stress test is reactive.  It has been documented that patient had 3 uc in minutes with no cervical change several days not indicating active labor.  Patient denies any other complaints.  Based on this report provider has given order for discharge.  A discharge order and diagnosis entered by a provider.   Labor discharge instructions reviewed with patient.

## 2019-11-10 NOTE — Discharge Instructions (Signed)

## 2019-11-16 ENCOUNTER — Other Ambulatory Visit: Payer: Self-pay

## 2019-11-16 ENCOUNTER — Inpatient Hospital Stay (HOSPITAL_COMMUNITY)
Admission: AD | Admit: 2019-11-16 | Discharge: 2019-11-16 | Disposition: A | Discharge status: Home / Self Care | Payer: Managed Care, Other (non HMO) | Attending: Obstetrics and Gynecology | Admitting: Obstetrics and Gynecology

## 2019-11-16 ENCOUNTER — Encounter (HOSPITAL_COMMUNITY): Payer: Self-pay | Admitting: Obstetrics and Gynecology

## 2019-11-16 DIAGNOSIS — Z885 Allergy status to narcotic agent status: Secondary | ICD-10-CM | POA: Insufficient documentation

## 2019-11-16 DIAGNOSIS — O26893 Other specified pregnancy related conditions, third trimester: Secondary | ICD-10-CM | POA: Insufficient documentation

## 2019-11-16 DIAGNOSIS — Z79899 Other long term (current) drug therapy: Secondary | ICD-10-CM | POA: Diagnosis not present

## 2019-11-16 DIAGNOSIS — Z3689 Encounter for other specified antenatal screening: Secondary | ICD-10-CM

## 2019-11-16 DIAGNOSIS — Z3A37 37 weeks gestation of pregnancy: Secondary | ICD-10-CM | POA: Diagnosis not present

## 2019-11-16 DIAGNOSIS — M549 Dorsalgia, unspecified: Secondary | ICD-10-CM | POA: Diagnosis not present

## 2019-11-16 DIAGNOSIS — N898 Other specified noninflammatory disorders of vagina: Secondary | ICD-10-CM | POA: Insufficient documentation

## 2019-11-16 LAB — POCT FERN TEST: POCT Fern Test: NEGATIVE

## 2019-11-16 NOTE — Discharge Instructions (Signed)
Braxton Hicks Contractions Contractions of the uterus can occur throughout pregnancy, but they are not always a sign that you are in labor. You may have practice contractions called Braxton Hicks contractions. These false labor contractions are sometimes confused with true labor. What are Braxton Hicks contractions? Braxton Hicks contractions are tightening movements that occur in the muscles of the uterus before labor. Unlike true labor contractions, these contractions do not result in opening (dilation) and thinning of the cervix. Toward the end of pregnancy (32-34 weeks), Braxton Hicks contractions can happen more often and may become stronger. These contractions are sometimes difficult to tell apart from true labor because they can be very uncomfortable. You should not feel embarrassed if you go to the hospital with false labor. Sometimes, the only way to tell if you are in true labor is for your health care provider to look for changes in the cervix. The health care provider will do a physical exam and may monitor your contractions. If you are not in true labor, the exam should show that your cervix is not dilating and your water has not broken. If there are no other health problems associated with your pregnancy, it is completely safe for you to be sent home with false labor. You may continue to have Braxton Hicks contractions until you go into true labor. How to tell the difference between true labor and false labor True labor  Contractions last 30-70 seconds.  Contractions become very regular.  Discomfort is usually felt in the top of the uterus, and it spreads to the lower abdomen and low back.  Contractions do not go away with walking.  Contractions usually become more intense and increase in frequency.  The cervix dilates and gets thinner. False labor  Contractions are usually shorter and not as strong as true labor contractions.  Contractions are usually irregular.  Contractions  are often felt in the front of the lower abdomen and in the groin.  Contractions may go away when you walk around or change positions while lying down.  Contractions get weaker and are shorter-lasting as time goes on.  The cervix usually does not dilate or become thin. Follow these instructions at home:   Take over-the-counter and prescription medicines only as told by your health care provider.  Keep up with your usual exercises and follow other instructions from your health care provider.  Eat and drink lightly if you think you are going into labor.  If Braxton Hicks contractions are making you uncomfortable: ? Change your position from lying down or resting to walking, or change from walking to resting. ? Sit and rest in a tub of warm water. ? Drink enough fluid to keep your urine pale yellow. Dehydration may cause these contractions. ? Do slow and deep breathing several times an hour.  Keep all follow-up prenatal visits as told by your health care provider. This is important. Contact a health care provider if:  You have a fever.  You have continuous pain in your abdomen. Get help right away if:  Your contractions become stronger, more regular, and closer together.  You have fluid leaking or gushing from your vagina.  You pass blood-tinged mucus (bloody show).  You have bleeding from your vagina.  You have low back pain that you never had before.  You feel your baby's head pushing down and causing pelvic pressure.  Your baby is not moving inside you as much as it used to. Summary  Contractions that occur before labor are   called Braxton Hicks contractions, false labor, or practice contractions.  Braxton Hicks contractions are usually shorter, weaker, farther apart, and less regular than true labor contractions. True labor contractions usually become progressively stronger and regular, and they become more frequent.  Manage discomfort from Braxton Hicks contractions  by changing position, resting in a warm bath, drinking plenty of water, or practicing deep breathing. This information is not intended to replace advice given to you by your health care provider. Make sure you discuss any questions you have with your health care provider. Document Revised: 06/03/2017 Document Reviewed: 11/04/2016 Elsevier Patient Education  2020 Elsevier Inc. Fetal Movement Counts Patient Name: ________________________________________________ Patient Due Date: ____________________ What is a fetal movement count?  A fetal movement count is the number of times that you feel your baby move during a certain amount of time. This may also be called a fetal kick count. A fetal movement count is recommended for every pregnant woman. You may be asked to start counting fetal movements as early as week 28 of your pregnancy. Pay attention to when your baby is most active. You may notice your baby's sleep and wake cycles. You may also notice things that make your baby move more. You should do a fetal movement count:  When your baby is normally most active.  At the same time each day. A good time to count movements is while you are resting, after having something to eat and drink. How do I count fetal movements? 1. Find a quiet, comfortable area. Sit, or lie down on your side. 2. Write down the date, the start time and stop time, and the number of movements that you felt between those two times. Take this information with you to your health care visits. 3. Write down your start time when you feel the first movement. 4. Count kicks, flutters, swishes, rolls, and jabs. You should feel at least 10 movements. 5. You may stop counting after you have felt 10 movements, or if you have been counting for 2 hours. Write down the stop time. 6. If you do not feel 10 movements in 2 hours, contact your health care provider for further instructions. Your health care provider may want to do additional tests  to assess your baby's well-being. Contact a health care provider if:  You feel fewer than 10 movements in 2 hours.  Your baby is not moving like he or she usually does. Date: ____________ Start time: ____________ Stop time: ____________ Movements: ____________ Date: ____________ Start time: ____________ Stop time: ____________ Movements: ____________ Date: ____________ Start time: ____________ Stop time: ____________ Movements: ____________ Date: ____________ Start time: ____________ Stop time: ____________ Movements: ____________ Date: ____________ Start time: ____________ Stop time: ____________ Movements: ____________ Date: ____________ Start time: ____________ Stop time: ____________ Movements: ____________ Date: ____________ Start time: ____________ Stop time: ____________ Movements: ____________ Date: ____________ Start time: ____________ Stop time: ____________ Movements: ____________ Date: ____________ Start time: ____________ Stop time: ____________ Movements: ____________ This information is not intended to replace advice given to you by your health care provider. Make sure you discuss any questions you have with your health care provider. Document Revised: 02/08/2019 Document Reviewed: 02/08/2019 Elsevier Patient Education  2020 Elsevier Inc.  

## 2019-11-16 NOTE — MAU Provider Note (Signed)
History   [id number]   Chief Complaint  Patient presents with  . Back Pain  . Rupture of Membranes    HPI Jacqueline Meadows is a 19 y.o. female  G2P1001 @37 .0 wks here with report of leaking fluid today.  No gush of fluid. Leaking of fluid has continued. She is unsure of color. Pt reports rare contractions. She denies vaginal bleeding. Last intercourse none during entire pregnancy. She reports good fetal movement. All other systems negative.    No LMP recorded. Patient is pregnant.  OB History  Gravida Para Term Preterm AB Living  2 1 1  0 0 1  SAB TAB Ectopic Multiple Live Births  0 0 0 0 1    # Outcome Date GA Lbr Len/2nd Weight Sex Delivery Anes PTL Lv  2 Current           1 Term 07/12/18 [redacted]w[redacted]d 05:10 / 06:20 2384 g F Vag-Spont None  LIV     Birth Comments: WNL     Past Medical History:  Diagnosis Date  . Anxiety   . Asthma   . Heart murmur    resolved by age [age]y/o.  [name] Kitchen Hyperthyroidism   . Migraine   . Premature baby    7 weeks early    Family History  Problem Relation Age of Onset  . Anxiety disorder Mother   . Depression Mother   . ADD / ADHD Mother   . Bipolar disorder Mother   . Migraines Mother   . Anxiety disorder Brother   . Depression Brother   . Bipolar disorder Paternal Aunt        3 paternal aunts bipolar dx and on disability  . Suicidality Paternal Aunt        paternal aunt attempted suicide 2 times  . Other Paternal Aunt        Hx of substance abuse  . ADD / ADHD Brother   . Ovarian cancer Maternal Aunt     Social History   Socioeconomic History  . Marital status: Single    Spouse name: Not on file  . Number of children: Not on file  . Years of education: Not on file  . Highest education level: Not on file  Occupational History  . Occupation: Surveyor, quantity: [employer]  Tobacco Use  . Smoking status: Never Smoker  . Smokeless tobacco: Never Used  Substance and Sexual Activity  . Alcohol use: No  . Drug use: No  . Sexual  activity: Not Currently    Birth control/protection: None  Other Topics Concern  . Not on file  Social History Narrative   Roanna attends eleventh grade at [employer]. She is doing well.   Lives with her parents and siblings.   Mother and 2 paternal aunts have bipolar disorder. Aunts have been hospitalized for mental illness and one aunt was hospitalized for substance abuse.    HC: 53.6 cm   PHQ-9 Total Score: 4   Social Determinants of Health   Financial Resource Strain:   . Difficulty of Paying Living Expenses:   Food Insecurity:   . Worried About Charity fundraiser in the Last Year:   . Arboriculturist in the Last Year:   Transportation Needs:   . Film/video editor (Medical):   [name] Kitchen Lack of Transportation (Non-Medical):   Physical Activity:   . Days of Exercise per Week:   . Minutes of Exercise per Session:  Stress:   . Feeling of Stress :   Social Connections:   . Frequency of Communication with Friends and Family:   . Frequency of Social Gatherings with Friends and Family:   . Attends Religious Services:   . Active Member of Clubs or Organizations:   . Attends Banker Meetings:   [name] Kitchen Marital Status:     Allergies  Allergen Reactions  . Vicodin [Hydrocodone-Acetaminophen] Rash    Pt states she can take Oxycodone    No current facility-administered medications on file prior to encounter.   Current Outpatient Medications on File Prior to Encounter  Medication Sig Dispense Refill  . levothyroxine (SYNTHROID, LEVOTHROID) 25 MCG tablet Take 25 mcg by mouth daily before breakfast.    . Prenatal Vit-Fe Fumarate-FA (PRENATAL MULTIVITAMIN) TABS tablet Take 1 tablet by mouth daily at 12 noon.       Review of Systems  Gastrointestinal: Negative for abdominal pain.  Genitourinary: Positive for vaginal discharge. Negative for vaginal bleeding.     Physical Exam   Vitals:   11/16/19 1320  BP: 114/79  Pulse: (!) 105  Resp: 18  Temp:  98.5 F (36.9 C)  TempSrc: Oral  SpO2: 100%  Weight: 63.9 kg  Height: 5\' 5"  (1.651 m)    Physical Exam  Nursing note and vitals reviewed. Constitutional: She is oriented to person, place, and time. She appears well-developed and well-nourished. No distress.  HENT:  Head: Normocephalic and atraumatic.  Cardiovascular: Normal rate.  Respiratory: Effort normal. No respiratory distress.  Genitourinary:    Genitourinary Comments: SSE: no pool, fern neg   Musculoskeletal:        General: Normal range of motion.     Cervical back: Normal range of motion.  Neurological: She is alert and oriented to person, place, and time.  Skin: Skin is warm and dry.  Psychiatric: She has a normal mood and affect.  EFM: 125 bpm, mod variability, + accels, no decels Toco: none  No results found for this or any previous visit (from the past 24 hour(s)).  MAU Course  Procedures  MDM No signs of SROM. Stable for discharge home.   Assessment and Plan   1. [redacted] weeks gestation of pregnancy   2. NST (non-stress test) reactive   3. Vaginal discharge during pregnancy in third trimester    Discharge home Follow up at [facility] next week Labor precautions FMCs  Allergies as of 11/16/2019      Reactions   Vicodin [hydrocodone-acetaminophen] Rash   Pt states she can take Oxycodone      Medication List    TAKE these medications   levothyroxine 25 MCG tablet Commonly known as: SYNTHROID Take 25 mcg by mouth daily before breakfast.   prenatal multivitamin Tabs tablet Take 1 tablet by mouth daily at 12 noon.        11/18/2019, CNM 11/16/2019 2:57 PM

## 2019-11-16 NOTE — MAU Note (Signed)
Urine in lab with no order

## 2019-11-16 NOTE — MAU Note (Signed)
Started leaking around clear fluid. Small amt, trickles with movement. At the time was having a throbbing pain in her low back and side. Had a spot of blood yesterday. Checked on Tues- some bleeding after exam. Was 2/50.

## 2019-11-21 ENCOUNTER — Other Ambulatory Visit: Payer: Self-pay

## 2019-11-21 ENCOUNTER — Encounter (HOSPITAL_COMMUNITY): Payer: Self-pay | Admitting: Obstetrics & Gynecology

## 2019-11-21 ENCOUNTER — Inpatient Hospital Stay (HOSPITAL_COMMUNITY)
Admission: AD | Admit: 2019-11-21 | Discharge: 2019-11-21 | Disposition: A | Discharge status: Home / Self Care | Payer: Managed Care, Other (non HMO) | Attending: Obstetrics & Gynecology | Admitting: Obstetrics & Gynecology

## 2019-11-21 ENCOUNTER — Inpatient Hospital Stay (EMERGENCY_DEPARTMENT_HOSPITAL)
Admission: AD | Admit: 2019-11-21 | Discharge: 2019-11-22 | Disposition: A | Discharge status: Home / Self Care | Payer: Managed Care, Other (non HMO) | Source: Home / Self Care | Attending: Obstetrics & Gynecology | Admitting: Obstetrics & Gynecology

## 2019-11-21 DIAGNOSIS — O471 False labor at or after 37 completed weeks of gestation: Secondary | ICD-10-CM | POA: Diagnosis not present

## 2019-11-21 DIAGNOSIS — O479 False labor, unspecified: Secondary | ICD-10-CM

## 2019-11-21 DIAGNOSIS — Z3A37 37 weeks gestation of pregnancy: Secondary | ICD-10-CM | POA: Insufficient documentation

## 2019-11-21 NOTE — Progress Notes (Addendum)
Wynelle Bourgeois CNM aware of pt's admission and assessment. Aware of the 2 sve since second visit to MAU tonight. Slight change this second sve so will observe another hour. Offered pt a Percocet for pain if she likes. Pt discussed this with her mother and decided she does not want pain med now. PT made aware if she changes her mind just let the RN know

## 2019-11-21 NOTE — MAU Provider Note (Signed)
S: Ms. Jacqueline Meadows is a 19 y.o. G2P1001 at [redacted]w[redacted]d  who presents to MAU today complaining contractions q 3-4 minutes since 1300. She denies vaginal bleeding. She denies LOF. She reports normal fetal movement.    O: BP 113/68   Pulse 94   Temp 98.5 F (36.9 C)   Resp 16   SpO2 99%   Cervical exam:  Dilation: 2 Effacement (%): 50 Station: -1 Presentation: Vertex Exam by:: Zenia Resides, RN [phone]   Fetal Monitoring: Baseline: 125 bpm Variability: Moderate  Accelerations: 15x15 Decelerations: None Contractions: Irregular with UI    A: SIUP at [redacted]w[redacted]d  False labor  P: Dc home with strict return precautions  Return to MAU if symptoms worsen   Venia Carbon I, NP 11/21/2019 7:34 PM

## 2019-11-21 NOTE — MAU Note (Signed)
Pt reports consistent ctx's since 1300.   Pt reports a couple of spots of blood in her panty liner today.   Denies LOF.   Reports +FM

## 2019-11-21 NOTE — MAU Note (Signed)
I have communicated with Venia Carbon, NP and reviewed vital signs:  Vitals:   11/21/19 1747 11/21/19 1919  BP: 126/83 113/68  Pulse: (!) 103 94  Resp:    Temp:    SpO2:      Vaginal exam:  Dilation: 2 Effacement (%): 50 Station: -1 Presentation: Vertex Exam by:: Zenia Resides, RN [phone],   Also reviewed contraction pattern and that non-stress test is reactive.  It has been documented that patient is contracting irregularly with no cervical change over 1.5 hours not indicating active labor.  Patient denies any other complaints.  Based on this report provider has given order for discharge.  A discharge order and diagnosis entered by a provider.   Labor discharge instructions reviewed with patient.

## 2019-11-21 NOTE — MAU Note (Signed)
Pt just left MAU around 2000 after being evaluated for labor. Was 2cm when left. States ctxs are stronger. Denies LOF or VB

## 2019-11-22 ENCOUNTER — Telehealth (HOSPITAL_COMMUNITY): Payer: Self-pay | Admitting: *Deleted

## 2019-11-22 ENCOUNTER — Encounter (HOSPITAL_COMMUNITY): Payer: Self-pay | Admitting: *Deleted

## 2019-11-22 DIAGNOSIS — O471 False labor at or after 37 completed weeks of gestation: Secondary | ICD-10-CM | POA: Diagnosis not present

## 2019-11-22 DIAGNOSIS — Z3A37 37 weeks gestation of pregnancy: Secondary | ICD-10-CM

## 2019-11-22 MED ORDER — OXYCODONE-ACETAMINOPHEN 5-325 MG PO TABS
1.0000 | ORAL_TABLET | Freq: Four times a day (QID) | ORAL | Status: DC | PRN
  Administered 2019-11-22: 1 via ORAL
  Filled 2019-11-22: qty 1

## 2019-11-22 NOTE — Progress Notes (Signed)
Wynelle Bourgeois CNM aware of 3rd sve without change. Ctxs spacing out. Pt will take Percocet at d/c to help her rest at home. Wynelle Bourgeois CNM will put in d/c orders and med order

## 2019-11-22 NOTE — MAU Provider Note (Signed)
S: Ms. Jacqueline Meadows is a 19 y.o. G2P1001 at [redacted]w[redacted]d  who presents to MAU today for labor evaluation.     Cervical exam by RN:  Dilation: 2.5 Effacement (%): 60 Cervical Position: Anterior Station: -1 Presentation: Vertex Exam by:: Quintella Baton RNC  Repeat exam twice more was unchanged (evaluated over 3 hours)  Fetal Monitoring: Baseline: 135 Variability: average Accelerations: present Decelerations: absent Contractions: irregular  MDM Discussed patient with RN. NST reviewed.  Category I FHR tracing  A: SIUP at [redacted]w[redacted]d  False labor  P: Discharge home Labor precautions and kick counts included in AVS Patient to follow-up with office as scheduled  Patient may return to MAU as needed or when in labor   Aviva Signs, [place] 11/22/2019 12:09 AM

## 2019-11-22 NOTE — Telephone Encounter (Signed)
Preadmission screen  

## 2019-11-22 NOTE — Progress Notes (Signed)
WRitten and verbal d/c instructions given and understanding voiced.  

## 2019-11-22 NOTE — Discharge Instructions (Signed)
Labor and Vaginal Delivery  Vaginal delivery means that you give birth by pushing your baby out of your birth canal (vagina). A team of health care providers will help you before, during, and after vaginal delivery. Birth experiences are unique for every woman and every pregnancy, and birth experiences vary depending on where you choose to give birth. What happens when I arrive at the birth center or hospital? Once you are in labor and have been admitted into the hospital or birth center, your health care provider may:  Review your pregnancy history and any concerns that you have.  Insert an IV into one of your veins. This may be used to give you fluids and medicines.  Check your blood pressure, pulse, temperature, and heart rate (vital signs).  Check whether your bag of water (amniotic sac) has broken (ruptured).  Talk with you about your birth plan and discuss pain control options. Monitoring Your health care provider may monitor your contractions (uterine monitoring) and your baby's heart rate (fetal monitoring). You may need to be monitored:  Often, but not continuously (intermittently).  All the time or for long periods at a time (continuously). Continuous monitoring may be needed if: ? You are taking certain medicines, such as medicine to relieve pain or make your contractions stronger. ? You have pregnancy or labor complications. Monitoring may be done by:  Placing a special stethoscope or a handheld monitoring device on your abdomen to check your baby's heartbeat and to check for contractions.  Placing monitors on your abdomen (external monitors) to record your baby's heartbeat and the frequency and length of contractions.  Placing monitors inside your uterus through your vagina (internal monitors) to record your baby's heartbeat and the frequency, length, and strength of your contractions. Depending on the type of monitor, it may remain in your uterus or on your baby's head  until birth.  Telemetry. This is a type of continuous monitoring that can be done with external or internal monitors. Instead of having to stay in bed, you are able to move around during telemetry. Physical exam Your health care provider may perform frequent physical exams. This may include:  Checking how and where your baby is positioned in your uterus.  Checking your cervix to determine: ? Whether it is thinning out (effacing). ? Whether it is opening up (dilating). What happens during labor and delivery?  Normal labor and delivery is divided into the following three stages: Stage 1  This is the longest stage of labor.  This stage can last for hours or days.  Throughout this stage, you will feel contractions. Contractions generally feel mild, infrequent, and irregular at first. They get stronger, more frequent (about every 2-3 minutes), and more regular as you move through this stage.  This stage ends when your cervix is completely dilated to 4 inches (10 cm) and completely effaced. Stage 2  This stage starts once your cervix is completely effaced and dilated and lasts until the delivery of your baby.  This stage may last from 20 minutes to 2 hours.  This is the stage where you will feel an urge to push your baby out of your vagina.  You may feel stretching and burning pain, especially when the widest part of your baby's head passes through the vaginal opening (crowning).  Once your baby is delivered, the umbilical cord will be clamped and cut. This usually occurs after waiting a period of 1-2 minutes after delivery.  Your baby will be placed on your   bare chest (skin-to-skin contact) in an upright position and covered with a warm blanket. Watch your baby for feeding cues, like rooting or sucking, and help the baby to your breast for his or her first feeding. Stage 3  This stage starts immediately after the birth of your baby and ends after you deliver the placenta.  This  stage may take anywhere from 5 to 30 minutes.  After your baby has been delivered, you will feel contractions as your body expels the placenta and your uterus contracts to control bleeding. What can I expect after labor and delivery?  After labor is over, you and your baby will be monitored closely until you are ready to go home to ensure that you are both healthy. Your health care team will teach you how to care for yourself and your baby.  You and your baby will stay in the same room (rooming in) during your hospital stay. This will encourage early bonding and successful breastfeeding.  You may continue to receive fluids and medicines through an IV.  Your uterus will be checked and massaged regularly (fundal massage).  You will have some soreness and pain in your abdomen, vagina, and the area of skin between your vaginal opening and your anus (perineum).  If an incision was made near your vagina (episiotomy) or if you had some vaginal tearing during delivery, cold compresses may be placed on your episiotomy or your tear. This helps to reduce pain and swelling.  You may be given a squirt bottle to use instead of wiping when you go to the bathroom. To use the squirt bottle, follow these steps: ? Before you urinate, fill the squirt bottle with warm water. Do not use hot water. ? After you urinate, while you are sitting on the toilet, use the squirt bottle to rinse the area around your urethra and vaginal opening. This rinses away any urine and blood. ? Fill the squirt bottle with clean water every time you use the bathroom.  It is normal to have vaginal bleeding after delivery. Wear a sanitary pad for vaginal bleeding and discharge. Summary  Vaginal delivery means that you will give birth by pushing your baby out of your birth canal (vagina).  Your health care provider may monitor your contractions (uterine monitoring) and your baby's heart rate (fetal monitoring).  Your health care  provider may perform a physical exam.  Normal labor and delivery is divided into three stages.  After labor is over, you and your baby will be monitored closely until you are ready to go home. This information is not intended to replace advice given to you by your health care provider. Make sure you discuss any questions you have with your health care provider. Document Revised: 07/26/2017 Document Reviewed: 07/26/2017 Elsevier Patient Education  2020 Elsevier Inc.  

## 2019-11-25 ENCOUNTER — Encounter (HOSPITAL_COMMUNITY): Payer: Self-pay | Admitting: Obstetrics and Gynecology

## 2019-11-25 ENCOUNTER — Inpatient Hospital Stay (EMERGENCY_DEPARTMENT_HOSPITAL)
Admission: AD | Admit: 2019-11-25 | Discharge: 2019-11-26 | Disposition: A | Discharge status: Home / Self Care | Payer: Managed Care, Other (non HMO) | Source: Home / Self Care | Attending: Obstetrics and Gynecology | Admitting: Obstetrics and Gynecology

## 2019-11-25 ENCOUNTER — Other Ambulatory Visit: Payer: Self-pay

## 2019-11-25 DIAGNOSIS — O479 False labor, unspecified: Secondary | ICD-10-CM

## 2019-11-25 DIAGNOSIS — O471 False labor at or after 37 completed weeks of gestation: Secondary | ICD-10-CM | POA: Insufficient documentation

## 2019-11-25 DIAGNOSIS — Z3A38 38 weeks gestation of pregnancy: Secondary | ICD-10-CM | POA: Insufficient documentation

## 2019-11-25 NOTE — MAU Note (Signed)
Pt reports to MAU c/o ctx every 30 seconds to . Pt reports some bloody show. Pt states she has increased pelvic pressure. +FM. Pt reports occasionally she is having some feelings as if she needs to push.

## 2019-11-26 DIAGNOSIS — Z3A38 38 weeks gestation of pregnancy: Secondary | ICD-10-CM | POA: Diagnosis not present

## 2019-11-26 DIAGNOSIS — O471 False labor at or after 37 completed weeks of gestation: Secondary | ICD-10-CM | POA: Diagnosis not present

## 2019-11-26 NOTE — MAU Provider Note (Addendum)
S: Ms. Jacqueline Meadows is a 19 y.o. G2P1001 at [redacted]w[redacted]d  who presents to MAU today for labor evaluation.     Cervical exam by RN:  Dilation: 3 Effacement (%): 60 Cervical Position: Middle Station: -3 Presentation: Vertex Exam by:: Jacqueline Elders, RN  Recheck of cervix by same RN was unchanged  Fetal Monitoring: Baseline: 120-125 Variability: average Accelerations: present Decelerations: absent Contractions: irregular  MDM Discussed patient with RN. NST reviewed.   A: SIUP at [redacted]w[redacted]d  False labor  P: Discharge home Labor precautions and kick counts included in AVS Patient to follow-up with office as scheduled  Patient may return to MAU as needed or when in labor   Aviva Signs, [place] 11/26/2019 1:09 AM

## 2019-11-26 NOTE — Discharge Instructions (Signed)
Braxton Hicks Contractions °Contractions of the uterus can occur throughout pregnancy, but they are not always a sign that you are in labor. You may have practice contractions called Braxton Hicks contractions. These false labor contractions are sometimes confused with true labor. °What are Braxton Hicks contractions? °Braxton Hicks contractions are tightening movements that occur in the muscles of the uterus before labor. Unlike true labor contractions, these contractions do not result in opening (dilation) and thinning of the cervix. Toward the end of pregnancy (32-34 weeks), Braxton Hicks contractions can happen more often and may become stronger. These contractions are sometimes difficult to tell apart from true labor because they can be very uncomfortable. You should not feel embarrassed if you go to the hospital with false labor. °Sometimes, the only way to tell if you are in true labor is for your health care provider to look for changes in the cervix. The health care provider will do a physical exam and may monitor your contractions. If you are not in true labor, the exam should show that your cervix is not dilating and your water has not broken. °If there are no other health problems associated with your pregnancy, it is completely safe for you to be sent home with false labor. You may continue to have Braxton Hicks contractions until you go into true labor. °How to tell the difference between true labor and false labor °True labor °· Contractions last 30-70 seconds. °· Contractions become very regular. °· Discomfort is usually felt in the top of the uterus, and it spreads to the lower abdomen and low back. °· Contractions do not go away with walking. °· Contractions usually become more intense and increase in frequency. °· The cervix dilates and gets thinner. °False labor °· Contractions are usually shorter and not as strong as true labor contractions. °· Contractions are usually irregular. °· Contractions  are often felt in the front of the lower abdomen and in the groin. °· Contractions may go away when you walk around or change positions while lying down. °· Contractions get weaker and are shorter-lasting as time goes on. °· The cervix usually does not dilate or become thin. °Follow these instructions at home: ° °· Take over-the-counter and prescription medicines only as told by your health care provider. °· Keep up with your usual exercises and follow other instructions from your health care provider. °· Eat and drink lightly if you think you are going into labor. °· If Braxton Hicks contractions are making you uncomfortable: °? Change your position from lying down or resting to walking, or change from walking to resting. °? Sit and rest in a tub of warm water. °? Drink enough fluid to keep your urine pale yellow. Dehydration may cause these contractions. °? Do slow and deep breathing several times an hour. °· Keep all follow-up prenatal visits as told by your health care provider. This is important. °Contact a health care provider if: °· You have a fever. °· You have continuous pain in your abdomen. °Get help right away if: °· Your contractions become stronger, more regular, and closer together. °· You have fluid leaking or gushing from your vagina. °· You pass blood-tinged mucus (bloody show). °· You have bleeding from your vagina. °· You have low back pain that you never had before. °· You feel your baby’s head pushing down and causing pelvic pressure. °· Your baby is not moving inside you as much as it used to. °Summary °· Contractions that occur before labor are   called Braxton Hicks contractions, false labor, or practice contractions. °· Braxton Hicks contractions are usually shorter, weaker, farther apart, and less regular than true labor contractions. True labor contractions usually become progressively stronger and regular, and they become more frequent. °· Manage discomfort from Braxton Hicks contractions  by changing position, resting in a warm bath, drinking plenty of water, or practicing deep breathing. °This information is not intended to replace advice given to you by your health care provider. Make sure you discuss any questions you have with your health care provider. °Document Revised: 06/03/2017 Document Reviewed: 11/04/2016 °Elsevier Patient Education © 2020 Elsevier Inc. ° °

## 2019-11-27 ENCOUNTER — Other Ambulatory Visit: Payer: Self-pay

## 2019-11-27 ENCOUNTER — Inpatient Hospital Stay (HOSPITAL_COMMUNITY)
Admission: AD | Admit: 2019-11-27 | Discharge: 2019-11-29 | DRG: 807 | Disposition: A | Discharge status: Home / Self Care | Payer: Managed Care, Other (non HMO) | Attending: Obstetrics & Gynecology | Admitting: Obstetrics & Gynecology

## 2019-11-27 ENCOUNTER — Encounter (HOSPITAL_COMMUNITY): Payer: Self-pay | Admitting: Obstetrics & Gynecology

## 2019-11-27 DIAGNOSIS — O99284 Endocrine, nutritional and metabolic diseases complicating childbirth: Secondary | ICD-10-CM | POA: Diagnosis present

## 2019-11-27 DIAGNOSIS — Z3490 Encounter for supervision of normal pregnancy, unspecified, unspecified trimester: Secondary | ICD-10-CM

## 2019-11-27 DIAGNOSIS — E039 Hypothyroidism, unspecified: Secondary | ICD-10-CM | POA: Diagnosis present

## 2019-11-27 DIAGNOSIS — O26893 Other specified pregnancy related conditions, third trimester: Principal | ICD-10-CM | POA: Diagnosis present

## 2019-11-27 DIAGNOSIS — Z6791 Unspecified blood type, Rh negative: Secondary | ICD-10-CM

## 2019-11-27 DIAGNOSIS — Z3A38 38 weeks gestation of pregnancy: Secondary | ICD-10-CM

## 2019-11-27 DIAGNOSIS — O9952 Diseases of the respiratory system complicating childbirth: Secondary | ICD-10-CM | POA: Diagnosis present

## 2019-11-27 DIAGNOSIS — J45990 Exercise induced bronchospasm: Secondary | ICD-10-CM | POA: Diagnosis present

## 2019-11-27 DIAGNOSIS — Z20822 Contact with and (suspected) exposure to covid-19: Secondary | ICD-10-CM | POA: Diagnosis present

## 2019-11-27 LAB — CBC
HCT: 33.9 % — ABNORMAL LOW (ref 36.0–46.0)
Hemoglobin: 11.1 g/dL — ABNORMAL LOW (ref 12.0–15.0)
MCH: 28.9 pg (ref 26.0–34.0)
MCHC: 32.7 g/dL (ref 30.0–36.0)
MCV: 88.3 fL (ref 80.0–100.0)
Platelets: 224 10*3/uL (ref 150–400)
RBC: 3.84 MIL/uL — ABNORMAL LOW (ref 3.87–5.11)
RDW: 12.8 % (ref 11.5–15.5)
WBC: 13.7 10*3/uL — ABNORMAL HIGH (ref 4.0–10.5)
nRBC: 0 % (ref 0.0–0.2)

## 2019-11-27 LAB — AMNISURE RUPTURE OF MEMBRANE (ROM) NOT AT ARMC: Amnisure ROM: NEGATIVE

## 2019-11-27 LAB — POCT FERN TEST: POCT Fern Test: NEGATIVE

## 2019-11-27 LAB — SARS CORONAVIRUS 2 BY RT PCR (HOSPITAL ORDER, PERFORMED IN ~~LOC~~ HOSPITAL LAB): SARS Coronavirus 2: NEGATIVE

## 2019-11-27 MED ORDER — FENTANYL CITRATE (PF) 100 MCG/2ML IJ SOLN
50.0000 ug | INTRAMUSCULAR | Status: DC | PRN
  Administered 2019-11-27: 50 ug via INTRAVENOUS
  Administered 2019-11-28 (×3): 100 ug via INTRAVENOUS
  Filled 2019-11-27 (×4): qty 2

## 2019-11-27 MED ORDER — OXYTOCIN 40 UNITS IN NORMAL SALINE INFUSION - SIMPLE MED
2.5000 [IU]/h | INTRAVENOUS | Status: DC
  Filled 2019-11-27: qty 1000

## 2019-11-27 MED ORDER — LIDOCAINE HCL (PF) 1 % IJ SOLN: 30.0000 mL | INTRAMUSCULAR | Status: DC | PRN

## 2019-11-27 MED ORDER — OXYTOCIN BOLUS FROM INFUSION
500.0000 mL | Freq: Once | INTRAVENOUS | Status: AC
  Administered 2019-11-28: 500 mL via INTRAVENOUS

## 2019-11-27 MED ORDER — ONDANSETRON HCL 4 MG/2ML IJ SOLN: 4.0000 mg | Freq: Four times a day (QID) | INTRAMUSCULAR | Status: DC | PRN

## 2019-11-27 MED ORDER — LACTATED RINGERS IV SOLN: INTRAVENOUS | Status: DC

## 2019-11-27 MED ORDER — SOD CITRATE-CITRIC ACID 500-334 MG/5ML PO SOLN: 30.0000 mL | ORAL | Status: DC | PRN

## 2019-11-27 MED ORDER — LACTATED RINGERS IV SOLN: 500.0000 mL | INTRAVENOUS | Status: DC | PRN

## 2019-11-27 MED ORDER — ACETAMINOPHEN 325 MG PO TABS: 650.0000 mg | ORAL_TABLET | ORAL | Status: DC | PRN

## 2019-11-27 NOTE — Progress Notes (Signed)
Jacqueline Meadows is a 19 y.o. G2P1001 at [redacted]w[redacted]d by ultrasound admitted for active labor  Subjective: Feeling increased intensity with CTX and pressure.  Objective: BP 130/86   Pulse 83   Temp 98.4 F (36.9 C) (Oral)   Resp 16   SpO2 97%  No intake/output data recorded. No intake/output data recorded.  FHT:  FHR: 125 bpm, variability: moderate,  accelerations:  Present,  decelerations:  Absent UC:   irregular, every 3-5 minutes SVE:   Dilation: 5 Effacement (%): 90 Station: -1, -2 Exam by:: Dr. Langston Masker AROM clear fluid  Labs: Lab Results  Component Value Date   WBC 13.7 (H) 11/27/2019   HGB 11.1 (L) 11/27/2019   HCT 33.9 (L) 11/27/2019   MCV 88.3 11/27/2019   PLT 224 11/27/2019    Assessment / Plan: Spontaneous labor, progressing normally  Labor: Progressing normally Preeclampsia:  n/a Fetal Wellbeing:  Category I Pain Control:  Labor support without medications I/D:  n/a Anticipated MOD:  NSVD  Mitchel Honour 11/27/2019, 10:57 PM

## 2019-11-27 NOTE — H&P (Signed)
Jacqueline Meadows is a 19 y.o. female presenting for labor.  Patient changed from 3 to 4.5 cm in MAU.  No VB.  Possible LOF but Amnisure and fern negative.  Active FM.  Antepartum course complicated by hypothyroidism well controlled with synthroid 25 mcg.  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  Patient's last pregnancy was complicated by IUGR (5#4).  Last u/s in the office on 5/4 EFW 5#15 (66%).  Rh negative.  GBS negative.    OB History    Gravida  2   Para  1   Term  1   Preterm  0   AB  0   Living  1     SAB  0   TAB  0   Ectopic  0   Multiple  0   Live Births  1          Past Medical History:  Diagnosis Date  . Anxiety   . Asthma   . Heart murmur    resolved by age [age]y/o.  [name] Kitchen Hyperthyroidism   . Migraine   . Premature baby    7 weeks early   Past Surgical History:  Procedure Laterality Date  . FOOT SURGERY Bilateral 03-2015   Rods in both feet   Family History: family history includes ADD / ADHD in her brother and mother; Anxiety disorder in her brother and mother; Bipolar disorder in her mother and paternal aunt; Cancer in her maternal grandmother, paternal aunt, and paternal uncle; Depression in her brother and mother; Hearing loss in her brother and mother; Migraines in her mother; Other in her paternal aunt; Ovarian cancer in her maternal aunt; Suicidality in her paternal aunt. Social History:  reports that she has never smoked. She has never used smokeless tobacco. She reports that she does not drink alcohol or use drugs.     Maternal Diabetes: No Genetic Screening: Normal Maternal Ultrasounds/Referrals: Normal Fetal Ultrasounds or other Referrals:  None Maternal Substance Abuse:  No Significant Maternal Medications:  Meds include: Syntroid Significant Maternal Lab Results:  Group B Strep negative and Rh negative Other Comments:  None  Review of Systems Maternal Medical History:  Reason for admission:  Contractions.   Contractions: Onset was 3-5 hours ago.   Frequency: regular.   Perceived severity is moderate.    Fetal activity: Perceived fetal activity is normal.   Last perceived fetal movement was within the past hour.    Prenatal complications: no prenatal complications Prenatal Complications - Diabetes: none.    Dilation: 4.5 Effacement (%): 80 Station: -1 Exam by:: B. Bowen, RN Blood pressure 122/74, pulse 97, temperature 98.1 F (36.7 C), temperature source Oral, resp. rate 18, SpO2 97 %, unknown if currently breastfeeding. Maternal Exam:  Uterine Assessment: Contraction strength is moderate.  Contraction frequency is regular.   Abdomen: Patient reports no abdominal tenderness. Fundal height is c/w dates.   Estimated fetal weight is 7#2.       Fetal Exam Fetal Monitor Review: Baseline rate: 125.  Variability: moderate (6-25 bpm).   Pattern: accelerations present and no decelerations.    Fetal State Assessment: Category I - tracings are normal.     Physical Exam  Constitutional: She is oriented to person, place, and time. She appears well-developed and well-nourished.  Respiratory: Effort normal.  GI: Soft. There is no rebound and no guarding.  Neurological: She is alert and oriented to person, place, and time.  Skin: Skin is warm and dry.  Psychiatric: She has a normal mood and affect. Her behavior is normal.    Prenatal labs: ABO, Rh: A/Negative/-- (10/29 0000) Antibody: Negative (10/29 0000) Rubella: Nonimmune (10/29 0000) RPR: Nonreactive (10/29 0000)  HBsAg: Negative (10/29 0000)  HIV: Non-reactive (10/29 0000)  GBS:   Negative  Assessment/Plan: 19yo G2P1001 at [redacted]w[redacted]d with SOL -Augment prn -CLEA if desired -Anticipate NSVD   Mitchel Honour 11/27/2019, 7:39 PM

## 2019-11-27 NOTE — MAU Note (Signed)
Pt presents to MAU with c/o ctx and PROM. She reports leaking of fluid 1630 and ctx at 0900 today. She is having some bloody show. +FM

## 2019-11-28 ENCOUNTER — Encounter (HOSPITAL_COMMUNITY): Payer: Self-pay | Admitting: Obstetrics & Gynecology

## 2019-11-28 LAB — TYPE AND SCREEN
ABO/RH(D): A NEG
Antibody Screen: POSITIVE

## 2019-11-28 LAB — RPR: RPR Ser Ql: NONREACTIVE

## 2019-11-28 MED ORDER — BENZOCAINE-MENTHOL 20-0.5 % EX AERO: 1.0000 "application " | INHALATION_SPRAY | CUTANEOUS | Status: DC | PRN

## 2019-11-28 MED ORDER — ZOLPIDEM TARTRATE 5 MG PO TABS: 5.0000 mg | ORAL_TABLET | Freq: Every evening | ORAL | Status: DC | PRN

## 2019-11-28 MED ORDER — ACETAMINOPHEN 325 MG PO TABS
650.0000 mg | ORAL_TABLET | ORAL | Status: DC | PRN
  Administered 2019-11-29: 650 mg via ORAL
  Filled 2019-11-28: qty 2

## 2019-11-28 MED ORDER — OXYTOCIN 40 UNITS IN NORMAL SALINE INFUSION - SIMPLE MED: 1.0000 m[IU]/min | INTRAVENOUS | Status: DC

## 2019-11-28 MED ORDER — ONDANSETRON HCL 4 MG/2ML IJ SOLN: 4.0000 mg | INTRAMUSCULAR | Status: DC | PRN

## 2019-11-28 MED ORDER — WITCH HAZEL-GLYCERIN EX PADS: 1.0000 "application " | MEDICATED_PAD | CUTANEOUS | Status: DC | PRN

## 2019-11-28 MED ORDER — SENNOSIDES-DOCUSATE SODIUM 8.6-50 MG PO TABS
2.0000 | ORAL_TABLET | ORAL | Status: DC
  Administered 2019-11-28: 2 via ORAL
  Filled 2019-11-28: qty 2

## 2019-11-28 MED ORDER — KETOROLAC TROMETHAMINE 30 MG/ML IJ SOLN
30.0000 mg | Freq: Once | INTRAMUSCULAR | Status: AC
  Administered 2019-11-28: 30 mg via INTRAVENOUS

## 2019-11-28 MED ORDER — TERBUTALINE SULFATE 1 MG/ML IJ SOLN: 0.2500 mg | Freq: Once | INTRAMUSCULAR | Status: DC | PRN

## 2019-11-28 MED ORDER — DIBUCAINE (PERIANAL) 1 % EX OINT: 1.0000 "application " | TOPICAL_OINTMENT | CUTANEOUS | Status: DC | PRN

## 2019-11-28 MED ORDER — SIMETHICONE 80 MG PO CHEW: 80.0000 mg | CHEWABLE_TABLET | ORAL | Status: DC | PRN

## 2019-11-28 MED ORDER — LEVOTHYROXINE SODIUM 50 MCG PO TABS
25.0000 ug | ORAL_TABLET | Freq: Every day | ORAL | Status: DC
  Administered 2019-11-29: 25 ug via ORAL
  Filled 2019-11-28: qty 1

## 2019-11-28 MED ORDER — TETANUS-DIPHTH-ACELL PERTUSSIS 5-2.5-18.5 LF-MCG/0.5 IM SUSP: 0.5000 mL | Freq: Once | INTRAMUSCULAR | Status: DC

## 2019-11-28 MED ORDER — IBUPROFEN 600 MG PO TABS
600.0000 mg | ORAL_TABLET | Freq: Four times a day (QID) | ORAL | Status: DC
  Administered 2019-11-28 – 2019-11-29 (×6): 600 mg via ORAL
  Filled 2019-11-28 (×6): qty 1

## 2019-11-28 MED ORDER — DIPHENHYDRAMINE HCL 25 MG PO CAPS: 25.0000 mg | ORAL_CAPSULE | Freq: Four times a day (QID) | ORAL | Status: DC | PRN

## 2019-11-28 MED ORDER — PRENATAL MULTIVITAMIN CH
1.0000 | ORAL_TABLET | Freq: Every day | ORAL | Status: DC
  Administered 2019-11-28 – 2019-11-29 (×2): 1 via ORAL
  Filled 2019-11-28 (×2): qty 1

## 2019-11-28 MED ORDER — KETOROLAC TROMETHAMINE 30 MG/ML IJ SOLN
INTRAMUSCULAR | Status: AC
  Filled 2019-11-28: qty 1

## 2019-11-28 MED ORDER — COCONUT OIL OIL: 1.0000 "application " | TOPICAL_OIL | Status: DC | PRN

## 2019-11-28 MED ORDER — ONDANSETRON HCL 4 MG PO TABS: 4.0000 mg | ORAL_TABLET | ORAL | Status: DC | PRN

## 2019-11-28 NOTE — Progress Notes (Signed)
MOB was referred for history of depression/anxiety. * Referral screened out by Clinical Social Worker because none of the following criteria appear to apply: ~ History of anxiety/depression during this pregnancy, or of post-partum depression following prior delivery. ~ Diagnosis of anxiety and/or depression within last 3 years. Per further chart review, it appears that MOB was diagnosed with anxiety in 2014.  OR * MOB's symptoms currently being treated with medication and/or therapy.   Please contact the Clinical Social Worker if needs arise, by MOB request, or if MOB scores greater than 9/yes to question 10 on Edinburgh Postpartum Depression Screen.     Ferguson Gertner S. Byren Pankow, MSW, LCSW [facility] [phone]   

## 2019-11-29 LAB — CBC
HCT: 29.5 % — ABNORMAL LOW (ref 36.0–46.0)
Hemoglobin: 9.6 g/dL — ABNORMAL LOW (ref 12.0–15.0)
MCH: 29.2 pg (ref 26.0–34.0)
MCHC: 32.5 g/dL (ref 30.0–36.0)
MCV: 89.7 fL (ref 80.0–100.0)
Platelets: 218 10*3/uL (ref 150–400)
RBC: 3.29 MIL/uL — ABNORMAL LOW (ref 3.87–5.11)
RDW: 12.8 % (ref 11.5–15.5)
WBC: 10.3 10*3/uL (ref 4.0–10.5)
nRBC: 0 % (ref 0.0–0.2)

## 2019-11-29 MED ORDER — IBUPROFEN 600 MG PO TABS: 600.0000 mg | ORAL_TABLET | Freq: Four times a day (QID) | ORAL | 0 refills | Status: DC | PRN

## 2019-11-29 MED ORDER — ACETAMINOPHEN 325 MG PO TABS: 650.0000 mg | ORAL_TABLET | Freq: Four times a day (QID) | ORAL | 0 refills | Status: DC | PRN

## 2019-11-29 MED ORDER — RHO D IMMUNE GLOBULIN 1500 UNIT/2ML IJ SOSY
300.0000 ug | PREFILLED_SYRINGE | Freq: Once | INTRAMUSCULAR | Status: AC
  Administered 2019-11-29: 300 ug via INTRAVENOUS
  Filled 2019-11-29: qty 2

## 2019-11-29 NOTE — Discharge Summary (Signed)
Postpartum Discharge Summary  Date of Service updated5/27/21     Patient Name: Jacqueline Meadows DOB: 2000/08/11 MRN: [id number]  Date of admission: 11/27/2019 Delivery date:11/28/2019  Delivering provider: Linda Hedges  Date of discharge: 11/29/2019  Admitting diagnosis: Pregnancy [Z34.90] Intrauterine pregnancy: [redacted]w[redacted]d    Secondary diagnosis:  Active Problems:   Pregnancy  Additional problems: none    Discharge diagnosis: Term Pregnancy Delivered                                              Post partum procedures:none Augmentation: Pitocin Complications: None  Hospital course: Onset of Labor With Vaginal Delivery      19y.o. yo [id number] admitted in Active Labor on 11/27/2019. Patient had an uncomplicated labor course as follows:  Membrane Rupture Time/Date: 10:43 PM ,11/27/2019   Delivery Method:Vaginal, Spontaneous  Episiotomy: None  Lacerations:  None  Patient had an uncomplicated postpartum course.  She is ambulating, tolerating a regular diet, passing flatus, and urinating well. Patient is discharged home in stable condition on 11/29/19.  Newborn Data: Birth date:11/28/2019  Birth time:6:26 AM  Gender:Female  Living status:Living  Apgars:8 ,9  Weight:3209 g   Magnesium Sulfate received: No BMZ received: No Rhophylac: MMR:No T-DaP:Given prenatally Flu: No Transfusion:No  Physical exam  Vitals:   11/28/19 0927 11/28/19 1418 11/28/19 1820 11/28/19 2251  BP: 114/76 111/69 107/69 127/83  Pulse: 81 88 72 89  Resp: '16 16 16 18  ' Temp: 98 F (36.7 C) 98.5 F (36.9 C) 98 F (36.7 C) 98.2 F (36.8 C)  TempSrc: Oral Oral Oral Oral  SpO2: 99% 98% 99% 100%   General: alert, cooperative and no distress Lochia: appropriate Uterine Fundus: firm Incision: Healing well with no significant drainage DVT Evaluation: No evidence of DVT seen on physical exam. Labs: Lab Results  Component Value Date   WBC 13.7 (H) 11/27/2019   HGB 11.1 (L) 11/27/2019   HCT 33.9 (L) 11/27/2019   MCV 88.3 11/27/2019   PLT 224 11/27/2019   CMP Latest Ref Rng & Units 11/01/2013  Glucose 70 - 99 mg/dL 103(H)  BUN 6 - 23 mg/dL 17  Creatinine 0.47 - 1.00 mg/dL 0.80  Sodium 137 - 147 mEq/L 140  Potassium 3.7 - 5.3 mEq/L 3.6(L)  Chloride 96 - 112 mEq/L 101   Edinburgh Score: Edinburgh Postnatal Depression Scale Screening Tool 11/28/2019  I have been able to laugh and see the funny side of things. (No Data)  I have looked forward with enjoyment to things. -  I have blamed myself unnecessarily when things went wrong. -  I have been anxious or worried for no good reason. -  I have felt scared or panicky for no good reason. -  Things have been getting on top of me. -  I have been so unhappy that I have had difficulty sleeping. -  I have felt sad or miserable. -  I have been so unhappy that I have been crying. -  The thought of harming myself has occurred to me. - Flavia Shipperostnatal Depression Scale Total -      After visit meds:  Allergies as of 11/29/2019      Reactions   Vicodin [hydrocodone-acetaminophen] Rash   Pt states she can take Oxycodone      Medication List    STOP taking  these medications   NIFEdipine 10 MG capsule Commonly known as: PROCARDIA     TAKE these medications   acetaminophen 325 MG tablet Commonly known as: Tylenol Take 2 tablets (650 mg total) by mouth every 6 (six) hours as needed (for pain scale < 4).   ibuprofen 600 MG tablet Commonly known as: ADVIL Take 1 tablet (600 mg total) by mouth every 6 (six) hours as needed.   levothyroxine 25 MCG tablet Commonly known as: SYNTHROID Take 25 mcg by mouth daily before breakfast.   prenatal multivitamin Tabs tablet Take 1 tablet by mouth daily at 12 noon.        Discharge home in stable condition Infant Feeding: Breast Infant Disposition:home with mother Discharge instruction: per After Visit Summary and Postpartum booklet. Activity: Advance as tolerated. Pelvic  rest for 6 weeks.  Diet: routine diet Anticipated Birth Control: Unsure Postpartum Appointment:6 weeks Additional Postpartum F/U: none Future Appointments: Future Appointments  Date Time Provider [place]  12/04/2019  9:15 AM [facility] None   Follow up Visit:      11/29/2019 Allena Katz, MD

## 2019-11-29 NOTE — Progress Notes (Signed)
Post Partum Day 1 Subjective: no complaints, up ad lib, voiding, tolerating PO and + flatus  Objective: Blood pressure 127/83, pulse 89, temperature 98.2 F (36.8 C), temperature source Oral, resp. rate 18, SpO2 100 %, unknown if currently breastfeeding.  Physical Exam:  General: alert, cooperative and no distress Lochia: appropriate Uterine Fundus: firm Incision: healing well DVT Evaluation: No evidence of DVT seen on physical exam.  Recent Labs    11/27/19 1907  HGB 11.1*  HCT 33.9*    Assessment/Plan: Discharge home  Wants to go home D/W circumcision of newborn female, risks reviewed. She states she understands and agrees.   LOS: 2 days   Jacqueline Meadows 11/29/2019, 6:54 AM

## 2019-11-29 NOTE — Lactation Note (Signed)
This note was copied from a baby's chart. Lactation Consultation Note  Patient Name: Boy Myalee Stengel IPJAS'N Date: 11/29/2019 Reason for consult: Follow-up assessment  P2 mother whose infant is now 22 hours old.  This is an ETI infant at 38+5 weeks.    Baby will be circumcised this morning and mother is expecting a discharge later today.    Mother has breast feeding experience and feels confident In her ability to continue breast feeding her son.  She has been leaking and had no questions/concerns related to breast feeding.  Encouraged to continue feeding 8-12 times/24 hours or sooner if baby shows feeding cues.  Due to early gestational age I suggested mother awaken baby every three hours if he does not self awaken.  Mother will continue to do hand expression and pump with the DEBP.  She will feed back any EBM she obtains to baby.  Engorgement prevention/treatment reviewed.  Demonstrated mother's manual pump and she also has a DEBP for home use.  Mother has good family support.  She has our OP phone number for any concerns after discharge.  Feeding expectations after circumcision reviewed.  Family members present.   Maternal Data    Feeding Feeding Type: Breast Fed  LATCH Score                   Interventions    Lactation Tools Discussed/Used     Consult Status Consult Status: Complete    Jaevian Shean R Jerimie Mancuso 11/29/2019, 9:35 AM

## 2019-11-30 LAB — RH IG WORKUP (INCLUDES ABO/RH)
ABO/RH(D): A NEG
Fetal Screen: NEGATIVE
Gestational Age(Wks): 38.5
Unit division: 0

## 2019-12-04 ENCOUNTER — Other Ambulatory Visit (HOSPITAL_COMMUNITY): Payer: Managed Care, Other (non HMO)

## 2019-12-06 ENCOUNTER — Inpatient Hospital Stay (HOSPITAL_COMMUNITY)
Admission: AD | Admit: 2019-12-06 | Payer: Managed Care, Other (non HMO) | Source: Home / Self Care | Admitting: Obstetrics & Gynecology

## 2019-12-06 ENCOUNTER — Inpatient Hospital Stay (HOSPITAL_COMMUNITY): Payer: Managed Care, Other (non HMO)

## 2020-05-05 ENCOUNTER — Ambulatory Visit (HOSPITAL_COMMUNITY)
Admission: EM | Admit: 2020-05-05 | Discharge: 2020-05-05 | Disposition: A | Discharge status: Home / Self Care | Payer: Managed Care, Other (non HMO)

## 2020-05-05 ENCOUNTER — Other Ambulatory Visit: Payer: Self-pay

## 2020-05-05 ENCOUNTER — Emergency Department (HOSPITAL_COMMUNITY)
Admission: EM | Admit: 2020-05-05 | Discharge: 2020-05-06 | Disposition: A | Discharge status: Home / Self Care | Payer: Managed Care, Other (non HMO) | Attending: Emergency Medicine | Admitting: Emergency Medicine

## 2020-05-05 ENCOUNTER — Emergency Department (HOSPITAL_COMMUNITY): Payer: Managed Care, Other (non HMO)

## 2020-05-05 ENCOUNTER — Encounter (HOSPITAL_COMMUNITY): Payer: Self-pay | Admitting: Emergency Medicine

## 2020-05-05 DIAGNOSIS — Z5321 Procedure and treatment not carried out due to patient leaving prior to being seen by health care provider: Secondary | ICD-10-CM | POA: Diagnosis not present

## 2020-05-05 DIAGNOSIS — R202 Paresthesia of skin: Secondary | ICD-10-CM | POA: Insufficient documentation

## 2020-05-05 DIAGNOSIS — W208XXA Other cause of strike by thrown, projected or falling object, initial encounter: Secondary | ICD-10-CM | POA: Insufficient documentation

## 2020-05-05 DIAGNOSIS — R55 Syncope and collapse: Secondary | ICD-10-CM | POA: Diagnosis not present
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
## 2020-05-05 NOTE — ED Notes (Signed)
Lwbs. 

## 2020-06-26 ENCOUNTER — Inpatient Hospital Stay (HOSPITAL_COMMUNITY)
Admission: AD | Admit: 2020-06-26 | Discharge: 2020-06-26 | Disposition: A | Discharge status: Home / Self Care | Payer: Managed Care, Other (non HMO) | Attending: Obstetrics and Gynecology | Admitting: Obstetrics and Gynecology

## 2020-06-26 ENCOUNTER — Other Ambulatory Visit: Payer: Self-pay

## 2020-06-26 DIAGNOSIS — Z3202 Encounter for pregnancy test, result negative: Secondary | ICD-10-CM

## 2020-06-26 DIAGNOSIS — N644 Mastodynia: Secondary | ICD-10-CM | POA: Diagnosis not present

## 2020-06-26 DIAGNOSIS — N939 Abnormal uterine and vaginal bleeding, unspecified: Secondary | ICD-10-CM

## 2020-06-26 DIAGNOSIS — Z3201 Encounter for pregnancy test, result positive: Secondary | ICD-10-CM | POA: Diagnosis not present

## 2020-06-26 DIAGNOSIS — R1084 Generalized abdominal pain: Secondary | ICD-10-CM | POA: Insufficient documentation

## 2020-06-26 LAB — POCT PREGNANCY, URINE: Preg Test, Ur: NEGATIVE

## 2020-06-26 LAB — HCG, QUANTITATIVE, PREGNANCY: hCG, Beta Chain, Quant, S: <1 m[IU]/mL (ref <5)

## 2020-06-26 NOTE — MAU Note (Signed)
.   Jacqueline Meadows is a 19 y.o. at Unknown here in MAU reporting: she has had pregnancy symptoms a couple of weeks. Lower abdominal cramping  LMP: 06/04/2020 Onset of complaint: 2 weeks Pain score:7 Vitals:   06/26/20 1023 06/26/20 1024  BP:  137/77  Pulse:  82  Resp: 16   Temp: 98.3 F (36.8 C)   SpO2: 100%      FHT: Lab orders placed from triage: UPT

## 2020-06-26 NOTE — MAU Provider Note (Signed)
[name] is a 19 y.o. G70P2002 female who presents to MAU today with complaint of VB, abdominal cramping, breast tenderness, and concern for pregnancy. She is using the birth control patch and was not able to replace it last Sunday because she couldn't find her new one. She reports brown spotting 2 days ago and red bleeding today. She had a faint positive HPT.   O BP 137/77   Pulse 82   Temp 98.3 F (36.8 C)   Resp 16   Ht 5\' 5"  (1.651 m)   Wt 51.3 kg   SpO2 100%   BMI 18.80 kg/m  Physical Exam Vitals and nursing note reviewed.  Constitutional:      Appearance: Normal appearance.  HENT:     Head: Normocephalic.  Cardiovascular:     Rate and Rhythm: Normal rate.  Pulmonary:     Effort: Pulmonary effort is normal. No respiratory distress.  Musculoskeletal:     Cervical back: Normal range of motion.  Neurological:     General: No focal deficit present.     Mental Status: She is alert and oriented to person, place, and time.  Psychiatric:        Mood and Affect: Mood normal.        Behavior: Behavior normal.    Results for orders placed or performed during the hospital encounter of 06/26/20 (from the past 24 hour(s))  Pregnancy, urine POC     Status: None   Collection Time: 06/26/20 10:22 AM  Result Value Ref Range   Preg Test, Ur NEGATIVE NEGATIVE  hCG, quantitative, pregnancy     Status: None   Collection Time: 06/26/20 11:12 AM  Result Value Ref Range   hCG, Beta Chain, Quant, S <1 <5 mIU/mL   MDM: qhcg ordered and reviewed. No signs of pregnancy. VB likely menses since missed her patch 5 days ago. Pt educated on backup method, and to restart patch this Sunday. Stable for discharge home.   A Non pregnant female Medical screening exam complete  P Discharge from MAU in stable condition Patient may return to MAU as needed for pregnancy related complaints  Sunday, Donette Larry 06/26/2020 1:47 PM

## 2020-07-05 NOTE — L&D Delivery Note (Signed)
Delivery Note At 4:30 PM a viable female was delivered via Vaginal, Spontaneous (Presentation: Left Occiput Anterior).  APGAR: 9, 9; weight  .   Placenta status: Spontaneous, Intact.  Cord: 3 vessels with the following complications: None.   Anesthesia: None Episiotomy: None Lacerations: None.  RV exam confirms intact.  Suture Repair: n/a Est. Blood Loss (mL): 200  Mom to postpartum.  Baby to Couplet care / Skin to Skin.  Lyn Henri 06/19/2021, 4:55 PM

## 2020-07-10 ENCOUNTER — Ambulatory Visit (INDEPENDENT_AMBULATORY_CARE_PROVIDER_SITE_OTHER): Payer: Managed Care, Other (non HMO)

## 2020-07-10 ENCOUNTER — Encounter: Payer: Self-pay | Admitting: Podiatry

## 2020-07-10 ENCOUNTER — Ambulatory Visit (INDEPENDENT_AMBULATORY_CARE_PROVIDER_SITE_OTHER): Payer: Managed Care, Other (non HMO) | Admitting: Podiatry

## 2020-07-10 ENCOUNTER — Other Ambulatory Visit: Payer: Self-pay

## 2020-07-10 DIAGNOSIS — M2041 Other hammer toe(s) (acquired), right foot: Secondary | ICD-10-CM | POA: Diagnosis not present

## 2020-07-10 DIAGNOSIS — M2042 Other hammer toe(s) (acquired), left foot: Secondary | ICD-10-CM

## 2020-07-10 DIAGNOSIS — M79671 Pain in right foot: Secondary | ICD-10-CM

## 2020-07-10 DIAGNOSIS — M79672 Pain in left foot: Secondary | ICD-10-CM

## 2020-07-10 DIAGNOSIS — L600 Ingrowing nail: Secondary | ICD-10-CM

## 2020-07-10 NOTE — Patient Instructions (Signed)

## 2020-07-11 NOTE — Progress Notes (Signed)
Subjective:   Patient ID: Jacqueline Meadows, female   DOB: 20 y.o.   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  Patient does not smoke and does work full-time   Review of Systems  All other systems reviewed and are negative.       Objective:  Physical Exam Vitals and nursing note reviewed.  Constitutional:      Appearance: She is well-developed and well-nourished.  Cardiovascular:     Pulses: Intact distal pulses.  Pulmonary:     Effort: Pulmonary effort is normal.  Musculoskeletal:        General: Normal range of motion.  Skin:    General: Skin is warm.  Neurological:     Mental Status: She is alert.     Neurovascular status was found to be intact muscle strength was found to be adequate range of motion adequate.  Patient is found to have incurvated left hallux medial border that is painful when pressed make shoe gear difficult and does not have any redness or drainage associated with it.  Has digital deformities of the distal portion of the toe second bilateral with previous digital fusions and attempts at distal arthroplasties     Assessment:  Ingrown toenail deformity left hallux medial border painful along with digital deformities of the lesser digits left over right     Plan:  H&P reviewed conditions discussed with patient and mother.  I do think ingrown toenail correction needs to be done due to pain and I did discuss the digital deformities and x-rays and do not recommend current surgery for this unless they were to worsen where we would consider something to try to straighten the deformed digits at this point I allowed her to read consent form understanding procedure risk and she  signed consent form.  I infiltrated the left hallux 60 mg Xylocaine Marcaine mixture sterile prep done and using sterile instrumentation I remove the medial border exposed matrix and applied phenol 3 applications 30 seconds followed by alcohol lavage sterile dressing and I gave instructions on soaks and to leave dressing on 24 hours but take it off earlier if any throbbing were to occur.  Patient is encouraged to call with questions concerns  X-rays indicate there is significant distal deformity of digit to bilateral the other digits for the most part are straight and in good alignment with no indications of arthritic processes occurring

## 2020-07-18 ENCOUNTER — Other Ambulatory Visit: Payer: Self-pay | Admitting: Podiatry

## 2020-07-18 DIAGNOSIS — M2041 Other hammer toe(s) (acquired), right foot: Secondary | ICD-10-CM

## 2020-09-04 IMAGING — US US MFM OB LIMITED
1 series · 15 of 23 positions shown · non-contrast
Comparison: none

[Series 1: us mfm ob limited · 23 acquisitions, 15 frames shown]
[im 1/23]
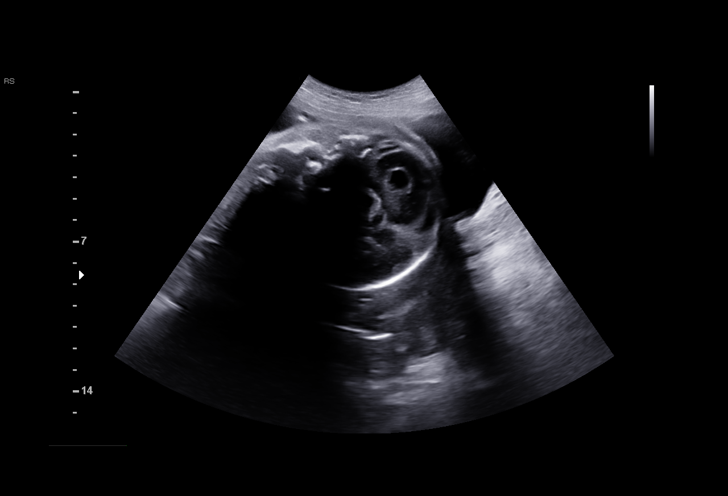
[im 3/23]
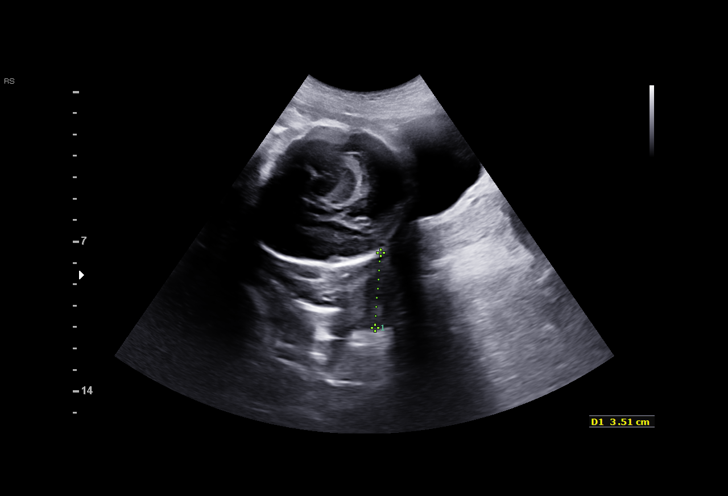
[im 4/23]
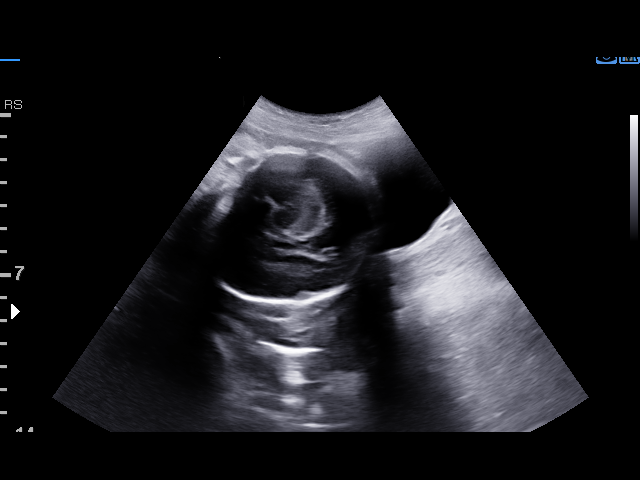
[im 6/23]
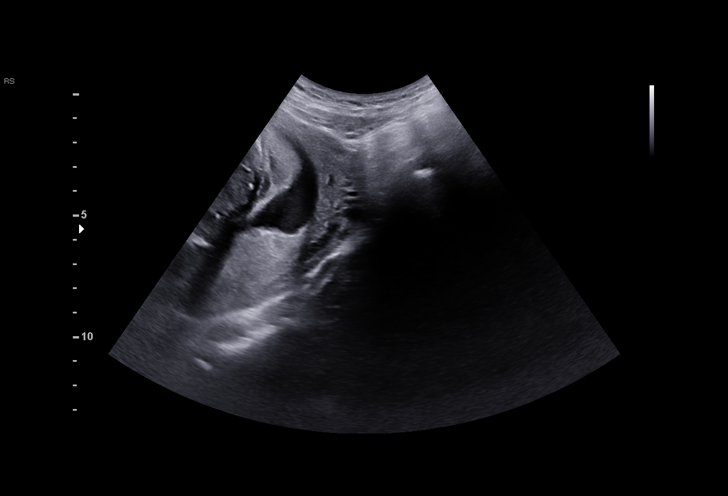
[im 7/23]
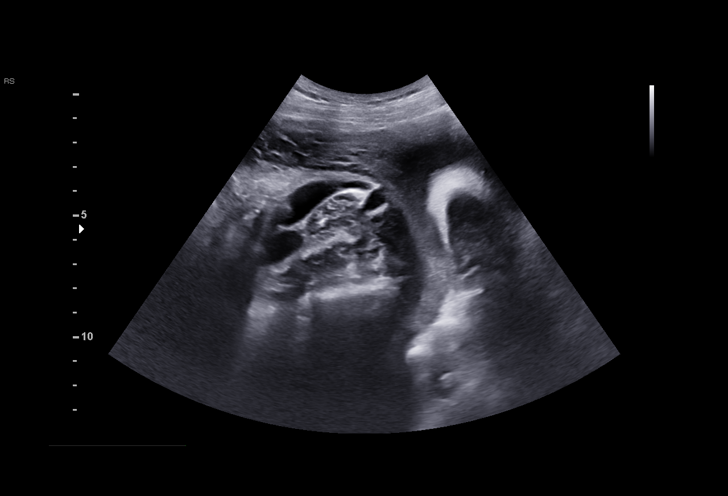
[im 9/23]
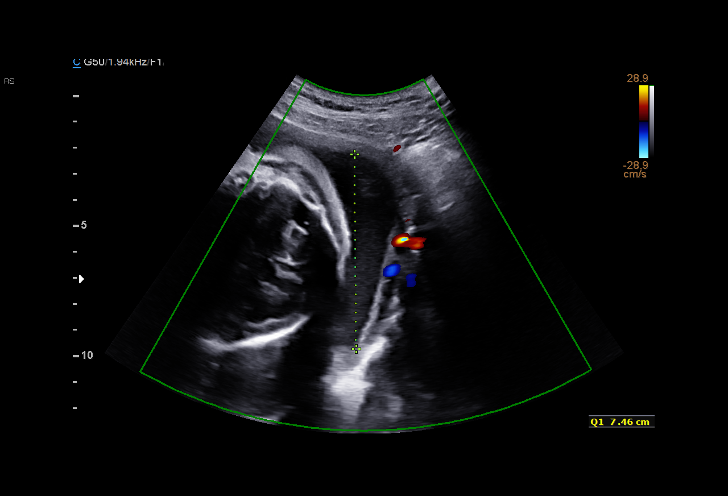
[im 10/23]
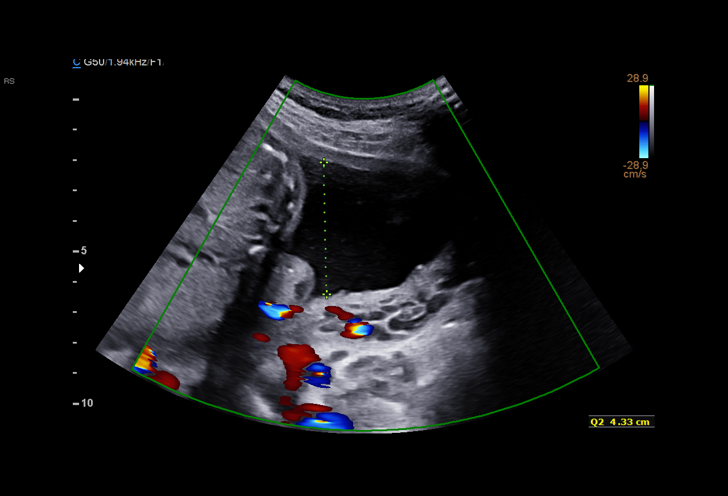
[im 12/23]
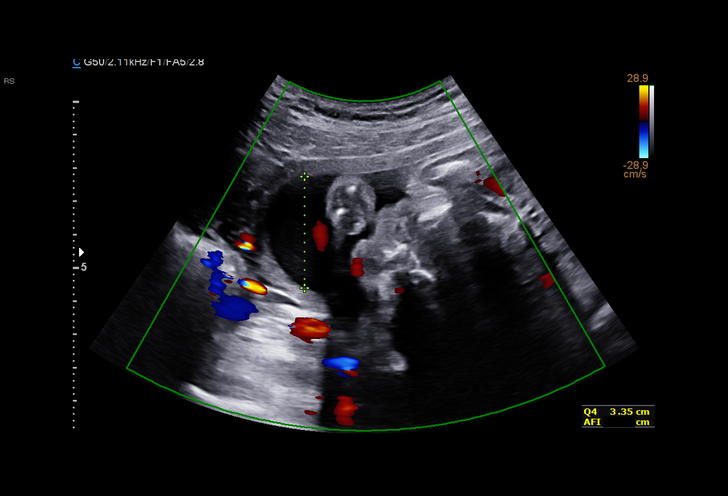
[im 14/23]
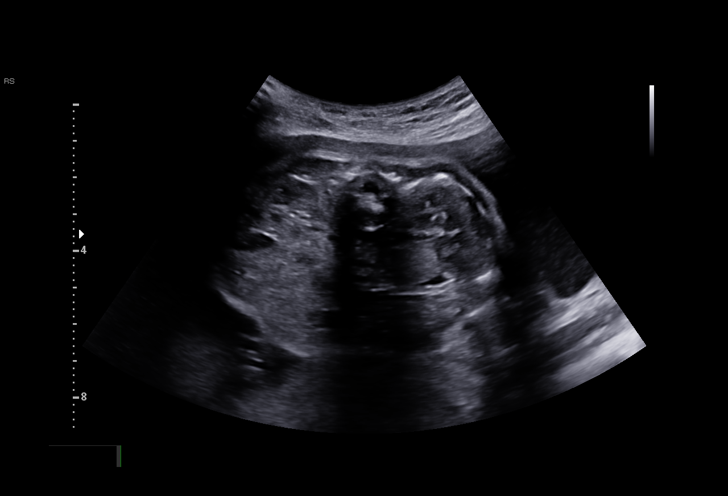
[im 15/23]
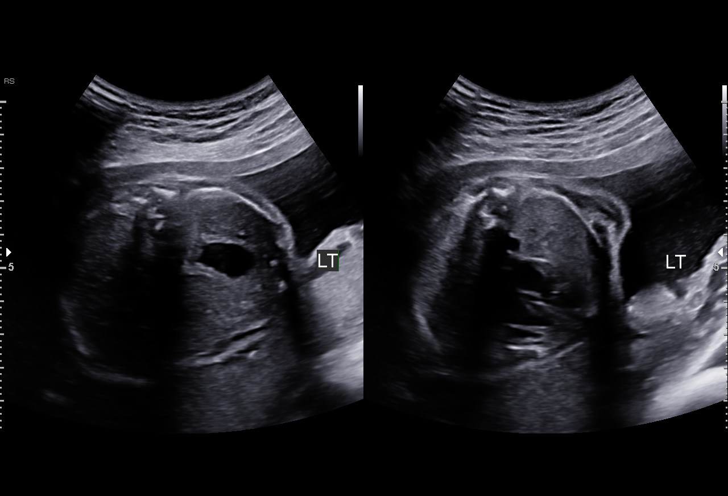
[im 17/23]
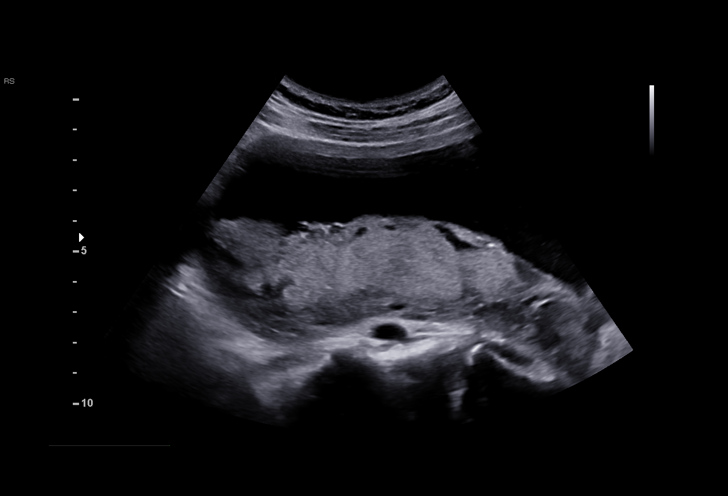
[im 18/23]
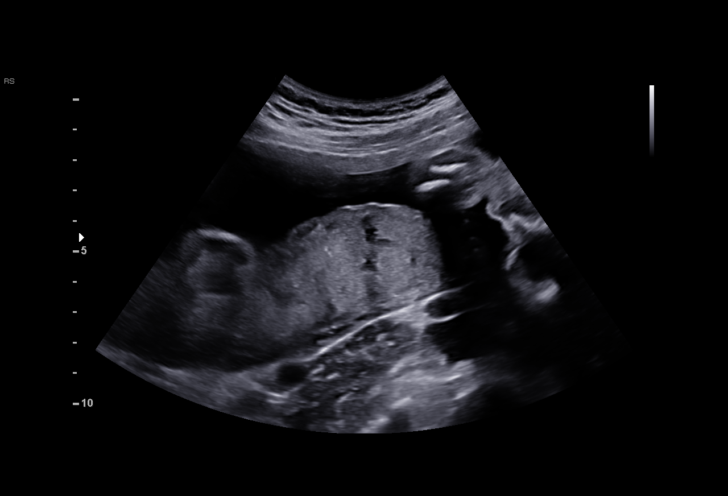
[im 20/23]
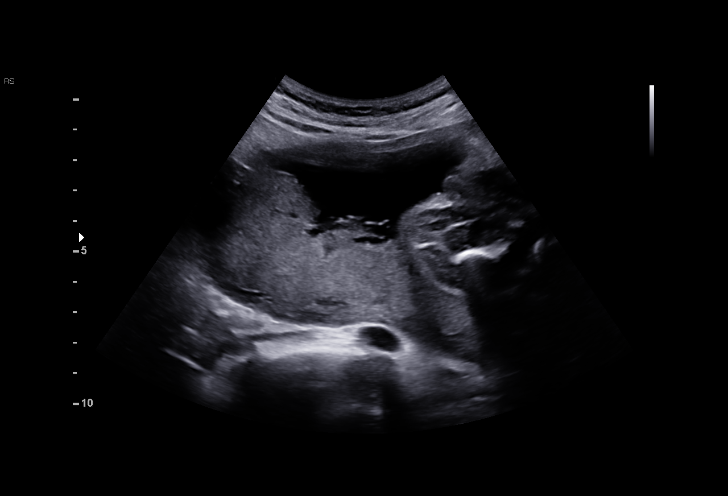
[im 21/23]
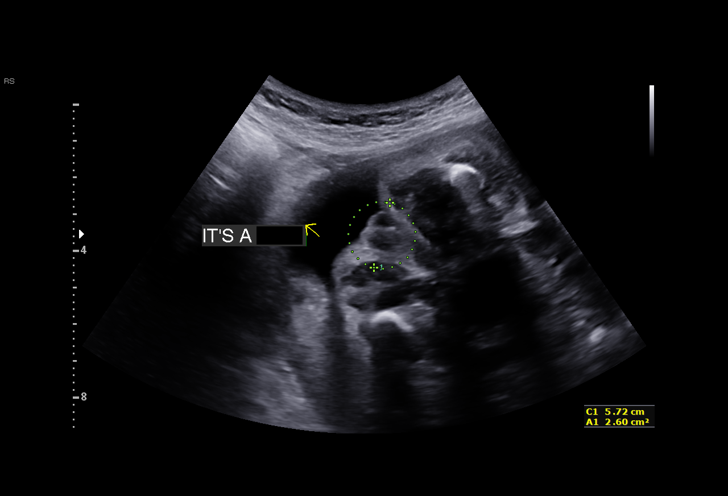
[im 23/23]
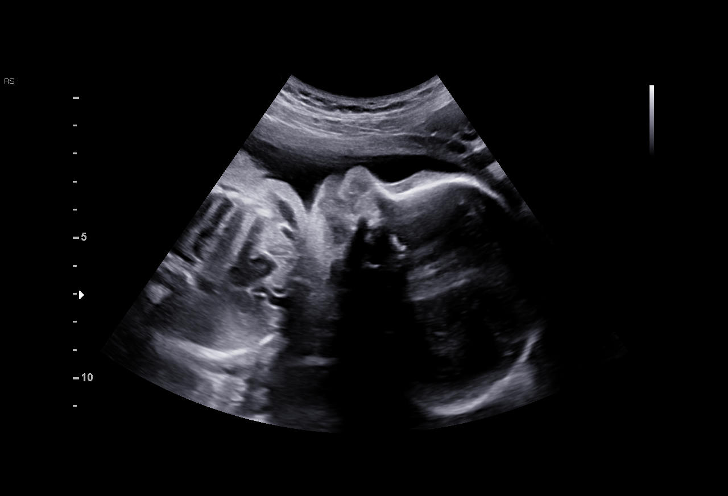

[15 of 23 positions shown; findings below may reference images not displayed]

Indications

28 weeks gestation of pregnancy
Abdominal pain in pregnancy
Fetal Evaluation

Num Of Fetuses:          1
Fetal Heart Rate(bpm):   142
Cardiac Activity:        Observed
Presentation:            Cephalic
Placenta:                Posterior

Amniotic Fluid
AFI FV:      Within normal limits

AFI Sum(cm)     %Tile       Largest Pocket(cm)
18.44           71

RUQ(cm)       RLQ(cm)       LUQ(cm)        LLQ(cm)
7.46
OB History

Gravidity:    1
Gestational Age

LMP:           28w 0d        Date:  10/19/17                 EDD:   07/26/18
Best:          28w 0d     Det. By:  LMP  (10/19/17)          EDD:   07/26/18
Anatomy
Stomach:               Appears normal, left   Bladder:                Appears normal
sided
Kidneys:               Appear normal

Other:  Female gender
Cervix Uterus Adnexa

Cervix
Length:            3.5  cm.
Normal appearance by transabdominal scan.

Uterus
No abnormality visualized.

Left Ovary
Not visualized.

Right Ovary
Not visualized.

Adnexa
No abnormality visualized. No adnexal mass
visualized.
Impression

Normal amniotic fluid
Normal placental location
Recommendations

Follow up as clinically indicated.

## 2020-11-13 ENCOUNTER — Inpatient Hospital Stay (HOSPITAL_COMMUNITY): Payer: Managed Care, Other (non HMO)

## 2020-11-13 ENCOUNTER — Other Ambulatory Visit: Payer: Self-pay

## 2020-11-13 ENCOUNTER — Encounter (HOSPITAL_COMMUNITY): Payer: Self-pay | Admitting: Obstetrics and Gynecology

## 2020-11-13 ENCOUNTER — Inpatient Hospital Stay (HOSPITAL_COMMUNITY)
Admission: AD | Admit: 2020-11-13 | Discharge: 2020-11-14 | Disposition: A | Discharge status: Home / Self Care | Payer: Managed Care, Other (non HMO) | Attending: Obstetrics and Gynecology | Admitting: Obstetrics and Gynecology

## 2020-11-13 DIAGNOSIS — O209 Hemorrhage in early pregnancy, unspecified: Secondary | ICD-10-CM

## 2020-11-13 DIAGNOSIS — O4690 Antepartum hemorrhage, unspecified, unspecified trimester: Secondary | ICD-10-CM | POA: Diagnosis present

## 2020-11-13 DIAGNOSIS — O3680X0 Pregnancy with inconclusive fetal viability, not applicable or unspecified: Secondary | ICD-10-CM | POA: Insufficient documentation

## 2020-11-13 DIAGNOSIS — O4691 Antepartum hemorrhage, unspecified, first trimester: Secondary | ICD-10-CM | POA: Insufficient documentation

## 2020-11-13 DIAGNOSIS — O26891 Other specified pregnancy related conditions, first trimester: Secondary | ICD-10-CM | POA: Diagnosis not present

## 2020-11-13 DIAGNOSIS — Z6791 Unspecified blood type, Rh negative: Secondary | ICD-10-CM

## 2020-11-13 DIAGNOSIS — R102 Pelvic and perineal pain: Secondary | ICD-10-CM | POA: Diagnosis not present

## 2020-11-13 DIAGNOSIS — Z3A01 Less than 8 weeks gestation of pregnancy: Secondary | ICD-10-CM | POA: Diagnosis not present

## 2020-11-13 LAB — URINALYSIS, ROUTINE W REFLEX MICROSCOPIC
Bacteria, UA: NONE SEEN
Bilirubin Urine: NEGATIVE
Glucose, UA: NEGATIVE mg/dL
Ketones, ur: NEGATIVE mg/dL
Leukocytes,Ua: NEGATIVE
Nitrite: NEGATIVE
Protein, ur: NEGATIVE mg/dL
Specific Gravity, Urine: 1.015 (ref 1.005–1.030)
pH: 7 (ref 5.0–8.0)

## 2020-11-13 LAB — COMPREHENSIVE METABOLIC PANEL
ALT: 21 U/L (ref 0–44)
AST: 23 U/L (ref 15–41)
Albumin: 4.1 g/dL (ref 3.5–5.0)
Alkaline Phosphatase: 53 U/L (ref 38–126)
Anion gap: 7 (ref 5–15)
BUN: 12 mg/dL (ref 6–20)
CO2: 26 mmol/L (ref 22–32)
Calcium: 9.2 mg/dL (ref 8.9–10.3)
Chloride: 104 mmol/L (ref 98–111)
Creatinine, Ser: 0.89 mg/dL (ref 0.44–1.00)
GFR, Estimated: >60 mL/min (ref >60)
Glucose, Bld: 103 mg/dL — ABNORMAL HIGH (ref 70–99)
Potassium: 3.8 mmol/L (ref 3.5–5.1)
Sodium: 137 mmol/L (ref 135–145)
Total Bilirubin: 0.6 mg/dL (ref 0.3–1.2)
Total Protein: 7 g/dL (ref 6.5–8.1)

## 2020-11-13 LAB — CBC
HCT: 36.3 % (ref 36.0–46.0)
Hemoglobin: 12 g/dL (ref 12.0–15.0)
MCH: 28.4 pg (ref 26.0–34.0)
MCHC: 33.1 g/dL (ref 30.0–36.0)
MCV: 86 fL (ref 80.0–100.0)
Platelets: 255 10*3/uL (ref 150–400)
RBC: 4.22 MIL/uL (ref 3.87–5.11)
RDW: 13.7 % (ref 11.5–15.5)
WBC: 8.3 10*3/uL (ref 4.0–10.5)
nRBC: 0 % (ref 0.0–0.2)

## 2020-11-13 LAB — WET PREP, GENITAL
Clue Cells Wet Prep HPF POC: NONE SEEN
Sperm: NONE SEEN
Trich, Wet Prep: NONE SEEN
Yeast Wet Prep HPF POC: NONE SEEN

## 2020-11-13 LAB — POCT PREGNANCY, URINE: Preg Test, Ur: POSITIVE — AB

## 2020-11-13 LAB — HCG, QUANTITATIVE, PREGNANCY: hCG, Beta Chain, Quant, S: 10468 m[IU]/mL — ABNORMAL HIGH (ref <5)

## 2020-11-13 NOTE — MAU Note (Signed)
Pt instructed in obtaining vag swabs which she did without difficulty. THen to Family Rm for blood work and then to lobby. Understands she will have u/s as well. Agrees with POC

## 2020-11-13 NOTE — MAU Note (Signed)
Pt was sitting in [facility] waiting for lab draw and was crying. Talked with pt awhile and offered to get her support person who is her boyfriend. SHe states she wants to be alone as this is his first baby and he will be worried to see her cry. She is a single mom and is use to handling things by herself. WE talked awhile and she felt better and thanked the RN for talking with her. She will return to lobby after lab draw with her boyfriend and states that is fine

## 2020-11-13 NOTE — MAU Note (Signed)
LMP was 10/03/20. I had positive upt on 11/02/20. Tonight I am having mild abd cramping and bright bleeding.

## 2020-11-14 DIAGNOSIS — O209 Hemorrhage in early pregnancy, unspecified: Secondary | ICD-10-CM

## 2020-11-14 DIAGNOSIS — O3680X0 Pregnancy with inconclusive fetal viability, not applicable or unspecified: Secondary | ICD-10-CM | POA: Diagnosis not present

## 2020-11-14 LAB — GC/CHLAMYDIA PROBE AMP (~~LOC~~) NOT AT ARMC
Chlamydia: NEGATIVE
Comment: NEGATIVE
Comment: NORMAL
Neisseria Gonorrhea: NEGATIVE

## 2020-11-14 MED ORDER — RHO D IMMUNE GLOBULIN 1500 UNIT/2ML IJ SOSY
300.0000 ug | PREFILLED_SYRINGE | Freq: Once | INTRAMUSCULAR | Status: DC
  Filled 2020-11-14: qty 2

## 2020-11-14 NOTE — MAU Note (Signed)
Wynelle Bourgeois CNM in [facility]  to talk with pt.

## 2020-11-14 NOTE — Discharge Instructions (Signed)
Threatened Miscarriage °A threatened miscarriage occurs when a woman has vaginal bleeding during the first 20 weeks of pregnancy but the pregnancy has not ended. If vaginal bleeding occurs during this time, the health care provider will do tests to make sure the woman is still pregnant. The woman's condition may be considered a threatened miscarriage if the tests show: °· That she is still pregnant. °· That the embryo or unborn baby (fetus) inside the uterus is still growing. °A threatened miscarriage does not mean your pregnancy will end, but it does increase the risk of losing your pregnancy (miscarriage). °What are the causes? °The cause of this condition is usually not known. °What increases the risk? °The following factors may make a pregnant woman more likely to have a miscarriage: °Certain medical conditions °· Conditions that affect the hormone balance in the body, such as thyroid disease or polycystic ovary syndrome. °· Diabetes. °· Autoimmune disorders. °· Infections. °· Bleeding disorders. °· Obesity. °Lifestyle factors °· Using products with tobacco or nicotine or being exposed to tobacco smoke. °· Having alcohol. °· Having large amounts of caffeine. °· Recreational drug use. °Problems with reproductive organs or structures °· Cervical insufficiency. This is when the the lowest part of the uterus (cervix) opens and thins before pregnancy is at term. °· Having a condition called Asherman syndrome, which causes scarring in the uterus or causes the uterus to be abnormal in structure. °· Fibrous growths, called fibroids, in the uterus. °· Congenital abnormalities. These problems are present at birth. °· Infection of the cervix or uterus. °Personal or medical history °· Injury (trauma). °· Having had a miscarriage before. °· Being younger than age 18 or older than age 35. °· Exposure to harmful substances in the environment. This may include radiation or heavy metals, such as lead. °· Using certain  medicines. °What are the signs or symptoms? °Symptoms of this condition include: °· Vaginal bleeding or spotting, with or without cramps or pain. °· Mild pain or cramps in your abdomen. °How is this diagnosed? °You may have tests to check whether you are still pregnant. These tests will be done if you have bleeding, with or without pain, in your abdomen before the 20th week of pregnancy. These tests include: °· Ultrasound. °· A physical exam. °· Measurement of your baby's heart rate. °· Lab tests, such as blood tests, urine tests, or swabs for infection. °You may be diagnosed with a threatened miscarriage if: °· Ultrasound testing shows that you are still pregnant. °· Your baby's heart rate is strong. °· A physical exam shows that your cervix is closed. °· Blood tests confirm that you are still pregnant.   °How is this treated? °No treatments have been shown to prevent a threatened miscarriage from going on to a complete miscarriage. However, the right home care is important. °Follow these instructions at home: °· Get plenty of rest. °· Do not have sex, douche, or put anything in your vagina, such as tampons, until your health care provider says it is okay. °· Do not smoke or use recreational drugs. °· Do not drink alcohol. °· Avoid caffeine. °· Keep all follow-up prenatal visits. This is important. °Contact a health care provider if: °· You have light vaginal bleeding or spotting while pregnant. °· You have pain or cramping in your abdomen. °· You have a fever. °Get help right away if: °· Heavy bleeding soaks through 2 large sanitary pads an hour for more than 2 hours. °· Blood clots come out of   your vagina. °· Tissue comes out of your vagina. °· You leak fluid, or you have a gush of fluid from your vagina. °· You have severe low back pain or cramps in your abdomen. °· You have a fever, chills, and severe pain in the abdomen. °Summary °· A threatened miscarriage occurs when a woman bleeds from the vagina during the  first 20 weeks of pregnancy but the pregnancy has not ended. °· The cause of a threatened miscarriage is usually not known. °· Symptoms of this condition may include vaginal bleeding and mild pain or cramps in your abdomen. °· No treatments have been shown to prevent a threatened miscarriage from going on to a complete miscarriage. °· Keep all follow-up prenatal visits. This is important. °This information is not intended to replace advice given to you by your health care provider. Make sure you discuss any questions you have with your health care provider. °Document Revised: 12/21/2019 Document Reviewed: 12/21/2019 °Elsevier Patient Education © 2021 [employer] ° °

## 2020-11-14 NOTE — MAU Provider Note (Signed)
Chief Complaint: Vaginal Bleeding  Patient seen by provider at 0043    SUBJECTIVE HPI: Jacqueline Meadows is a 20 y.o. G3P2002 at 7465w0d by LMP who presents to maternity admissions reporting bright red vaginal bleeding and pelvic cramping.. She denies vaginal itching/burning, urinary symptoms, h/a, dizziness, n/v, or fever/chills.    Vaginal Bleeding The patient's primary symptoms include pelvic pain and vaginal bleeding. The patient's pertinent negatives include no genital itching, genital lesions or genital odor. This is a new problem. The current episode started today. The problem has been unchanged. The pain is mild. She is pregnant. Associated symptoms include abdominal pain. Pertinent negatives include no chills, constipation, diarrhea, fever, nausea or vomiting. The vaginal discharge was bloody. The vaginal bleeding is lighter than menses. She has not been passing clots. She has not been passing tissue. Nothing aggravates the symptoms. She has tried nothing for the symptoms.  Abdominal Pain This is a new problem. The current episode started today. The problem occurs intermittently. The problem has been unchanged. The pain is located in the suprapubic region. The pain is mild. The quality of the pain is cramping. The abdominal pain does not radiate. Pertinent negatives include no constipation, diarrhea, fever, nausea or vomiting. Nothing aggravates the pain. The pain is relieved by nothing. She has tried nothing for the symptoms.   RN Note: LMP was 10/03/20. I had positive upt on 11/02/20. Tonight I am having mild abd cramping and bright bleeding.  Past Medical History:  Diagnosis Date  . Anxiety   . Asthma   . Heart murmur    resolved by age 30/20y/o.  [name] Kitchen. Hyperthyroidism   . Migraine   . Premature baby    7 weeks early   Past Surgical History:  Procedure Laterality Date  . FOOT SURGERY Bilateral 03-2015   Rods in both feet   Social History   Socioeconomic History  . Marital status:  Single    Spouse name: Not on file  . Number of children: Not on file  . Years of education: Not on file  . Highest education level: Not on file  Occupational History  . Occupation: LobbyistCashier    Employer: [employer]  Tobacco Use  . Smoking status: Never Smoker  . Smokeless tobacco: Never Used  Vaping Use  . Vaping Use: Never used  Substance and Sexual Activity  . Alcohol use: No  . Drug use: No  . Sexual activity: Not Currently    Birth control/protection: None  Other Topics Concern  . Not on file  Social History Narrative   Layni attends eleventh grade at [employer]. She is doing well.   Lives with her parents and siblings.   Mother and 2 paternal aunts have bipolar disorder. Aunts have been hospitalized for mental illness and one aunt was hospitalized for substance abuse.    HC: 53.6 cm   PHQ-9 Total Score: 4   Social Determinants of Health   Financial Resource Strain: Not on file  Food Insecurity: Not on file  Transportation Needs: Not on file  Physical Activity: Not on file  Stress: Not on file  Social Connections: Not on file  Intimate Partner Violence: Not on file   No current facility-administered medications on file prior to encounter.   Current Outpatient Medications on File Prior to Encounter  Medication Sig Dispense Refill  . acetaminophen (TYLENOL) 325 MG tablet Take 2 tablets (650 mg total) by mouth every 6 (six) hours as needed (for pain scale < 4).  60 tablet 0  . ibuprofen (ADVIL) 600 MG tablet Take 1 tablet (600 mg total) by mouth every 6 (six) hours as needed. 60 tablet 0  . levothyroxine (SYNTHROID, LEVOTHROID) 25 MCG tablet Take 25 mcg by mouth daily before breakfast.     Allergies  Allergen Reactions  . Vicodin [Hydrocodone-Acetaminophen] Rash    Pt states she can take Oxycodone    I have reviewed patient's Past Medical Hx, Surgical Hx, Family Hx, Social Hx, medications and allergies.   ROS:  Review of Systems  Constitutional:  Negative for chills and fever.  Gastrointestinal: Positive for abdominal pain. Negative for constipation, diarrhea, nausea and vomiting.  Genitourinary: Positive for pelvic pain and vaginal bleeding.   Review of Systems  Other systems negative   Physical Exam  Physical Exam Patient Vitals for the past 24 hrs:  BP Temp Pulse Resp SpO2 Height Weight  11/13/20 2023 -- -- (!) 105 -- 100 % -- --  11/13/20 2020 125/67 -- (!) 113 -- -- -- --  11/13/20 2019 -- 98.4 F (36.9 C) -- 18 -- 5\' 5"  (1.651 m) 52.2 kg   Constitutional: Well-developed, well-nourished female in no acute distress, tearful.  FOB at her side Cardiovascular: normal rate Respiratory: normal effort GI: Abd soft, non-tender.  MS: Extremities nontender, no edema, normal ROM Neurologic: Alert and oriented x 4.  GU: Neg CVAT.  PELVIC EXAM: Pelvic exam deferred due to excessive patient volume on unit, and mild nature of pain, non-hemorrhagic bleeding.  Patient self-swabbed for cultures  LAB RESULTS Results for orders placed or performed during the hospital encounter of 11/13/20 (from the past 24 hour(s))  Urinalysis, Routine w reflex microscopic Urine, Clean Catch     Status: Abnormal   Collection Time: 11/13/20  8:29 PM  Result Value Ref Range   Color, Urine YELLOW YELLOW   APPearance CLEAR CLEAR   Specific Gravity, Urine 1.015 1.005 - 1.030   pH 7.0 5.0 - 8.0   Glucose, UA NEGATIVE NEGATIVE mg/dL   Hgb urine dipstick MODERATE (A) NEGATIVE   Bilirubin Urine NEGATIVE NEGATIVE   Ketones, ur NEGATIVE NEGATIVE mg/dL   Protein, ur NEGATIVE NEGATIVE mg/dL   Nitrite NEGATIVE NEGATIVE   Leukocytes,Ua NEGATIVE NEGATIVE   RBC / HPF 6-10 0 - 5 RBC/hpf   WBC, UA 0-5 0 - 5 WBC/hpf   Bacteria, UA NONE SEEN NONE SEEN   Squamous Epithelial / LPF 0-5 0 - 5   Mucus PRESENT   Pregnancy, urine POC     Status: Abnormal   Collection Time: 11/13/20  8:30 PM  Result Value Ref Range   Preg Test, Ur POSITIVE (A) NEGATIVE  Wet prep,  genital     Status: Abnormal   Collection Time: 11/13/20  9:55 PM   Specimen: Vaginal  Result Value Ref Range   Yeast Wet Prep HPF POC NONE SEEN NONE SEEN   Trich, Wet Prep NONE SEEN NONE SEEN   Clue Cells Wet Prep HPF POC NONE SEEN NONE SEEN   WBC, Wet Prep HPF POC MANY (A) NONE SEEN   Sperm NONE SEEN   CBC     Status: None   Collection Time: 11/13/20 10:37 PM  Result Value Ref Range   WBC 8.3 4.0 - 10.5 K/uL   RBC 4.22 3.87 - 5.11 MIL/uL   Hemoglobin 12.0 12.0 - 15.0 g/dL   HCT 01/13/21 13.2 - 44.0 %   MCV 86.0 80.0 - 100.0 fL   MCH 28.4 26.0 - 34.0 pg  MCHC 33.1 30.0 - 36.0 g/dL   RDW 26.2 03.5 - 59.7 %   Platelets 255 150 - 400 K/uL   nRBC 0.0 0.0 - 0.2 %  Comprehensive metabolic panel     Status: Abnormal   Collection Time: 11/13/20 10:37 PM  Result Value Ref Range   Sodium 137 135 - 145 mmol/L   Potassium 3.8 3.5 - 5.1 mmol/L   Chloride 104 98 - 111 mmol/L   CO2 26 22 - 32 mmol/L   Glucose, Bld 103 (H) 70 - 99 mg/dL   BUN 12 6 - 20 mg/dL   Creatinine, Ser 4.16 0.44 - 1.00 mg/dL   Calcium 9.2 8.9 - 38.4 mg/dL   Total Protein 7.0 6.5 - 8.1 g/dL   Albumin 4.1 3.5 - 5.0 g/dL   AST 23 15 - 41 U/L   ALT 21 0 - 44 U/L   Alkaline Phosphatase 53 38 - 126 U/L   Total Bilirubin 0.6 0.3 - 1.2 mg/dL   GFR, Estimated >53 >64 mL/min   Anion gap 7 5 - 15  hCG, quantitative, pregnancy     Status: Abnormal   Collection Time: 11/13/20 10:37 PM  Result Value Ref Range   hCG, Beta Chain, Quant, S 10,468 (H) <5 mIU/mL  Rh IG workup (includes ABO/Rh)     Status: None (Preliminary result)   Collection Time: 11/13/20 10:37 PM  Result Value Ref Range   Gestational Age(Wks) 5.6    ABO/RH(D) A NEG    Antibody Screen      NEG Performed at [facility], [address], [place], [place] [address]    Unit Number [id number]    Blood Component Type RHIG    Unit division 00    Status of Unit ALLOCATED    Transfusion Status OK TO TRANSFUSE     --/--/A NEG (05/12  2237)  IMAGING US OB LESS THAN 14 WEEKS WITH OB TRANSVAGINAL  Result Date: 11/13/2020 CLINICAL DATA:  Vaginal bleeding EXAM: OBSTETRIC <14 WK [place] AND TRANSVAGINAL OB US TECHNIQUE: Both transabdominal and transvaginal ultrasound examinations were performed for complete evaluation of the gestation as well as the maternal uterus, adnexal regions, and pelvic cul-de-sac. Transvaginal technique was performed to assess early pregnancy. COMPARISON:  None. FINDINGS: Intrauterine gestational sac: Single intrauterine gestational sac Yolk sac:  Not visualized Embryo: Linear focus within the gestational sac questionable for embryo given non visualized yolk sac. Cardiac Activity: Not visualized Subchorionic hemorrhage: Moderate subchorionic hemorrhage along the posterior and inferior sac. Maternal uterus/adnexae: Ovaries are within normal limits. Right ovary measures 2.9 x 1.8 x 2.4 cm. Left ovary measures 2.5 x 1.8 x 2.5 cm. IMPRESSION: 1. Probable intrauterine gestational sac. The yolk sac and embryo are non definitively visualized. Recommend follow-up quantitative B-HCG levels and follow-up US in 14 days to assess viability. This recommendation follows SRU consensus guidelines: Diagnostic Criteria for Nonviable Pregnancy Early in the First Trimester. Malva Limes Med 2013; 488:8916-94. 2. Moderate subchorionic hemorrhage Electronically Signed   By: Jasmine Pang M.D.   On: 11/13/2020 22:52    MAU Management/MDM: Ordered usual first trimester r/o ectopic labs.   Pelvic exam and cultures done Will check baseline Ultrasound to rule out ectopic.  This bleeding/pain can represent a normal pregnancy with bleeding, spontaneous abortion or even an ectopic which can be life-threatening.  The process as listed above helps to determine which of these is present.  Recommended Rhophylac.  Explained in detail reasons for giving it and she refuses  to get it now states "I just want to go home" I told her to think about it and  reconsider taking it Sunday.   ASSESSMENT 1. Vaginal bleeding in pregnancy   2. Bleeding in early pregnancy   3. Pregnancy of unknown anatomic location   4. Pelvic pain affecting pregnancy in first trimester, antepartum   5.    Rh Negative  PLAN Discharge home Plan to repeat HCG level in 48 hours in MAU Will repeat  Ultrasound in about 7-10 days if HCG levels double appropriately  Ectopic precautions  Pt stable at time of discharge. Encouraged to return here if she develops worsening of symptoms, increase in pain, fever, or other concerning symptoms.    Wynelle Bourgeois CNM, MSN Certified Nurse-Midwife 11/14/2020  12:46 AM

## 2020-11-16 ENCOUNTER — Other Ambulatory Visit: Payer: Self-pay

## 2020-11-16 ENCOUNTER — Inpatient Hospital Stay (HOSPITAL_COMMUNITY)
Admission: AD | Admit: 2020-11-16 | Discharge: 2020-11-16 | Disposition: A | Discharge status: Home / Self Care | Payer: Managed Care, Other (non HMO) | Attending: Obstetrics and Gynecology | Admitting: Obstetrics and Gynecology

## 2020-11-16 DIAGNOSIS — O3680X0 Pregnancy with inconclusive fetal viability, not applicable or unspecified: Secondary | ICD-10-CM

## 2020-11-16 DIAGNOSIS — Z885 Allergy status to narcotic agent status: Secondary | ICD-10-CM | POA: Insufficient documentation

## 2020-11-16 DIAGNOSIS — O26891 Other specified pregnancy related conditions, first trimester: Secondary | ICD-10-CM

## 2020-11-16 DIAGNOSIS — Z3A01 Less than 8 weeks gestation of pregnancy: Secondary | ICD-10-CM | POA: Diagnosis not present

## 2020-11-16 DIAGNOSIS — Z6791 Unspecified blood type, Rh negative: Secondary | ICD-10-CM

## 2020-11-16 LAB — RH IG WORKUP (INCLUDES ABO/RH)
ABO/RH(D): A NEG
Antibody Screen: NEGATIVE
Gestational Age(Wks): 5.6
Unit division: 0

## 2020-11-16 LAB — HCG, QUANTITATIVE, PREGNANCY: hCG, Beta Chain, Quant, S: 15301 m[IU]/mL — ABNORMAL HIGH (ref <5)

## 2020-11-16 MED ORDER — RHO D IMMUNE GLOBULIN 1500 UNIT/2ML IJ SOSY
300.0000 ug | PREFILLED_SYRINGE | Freq: Once | INTRAMUSCULAR | Status: AC
  Administered 2020-11-16: 300 ug via INTRAMUSCULAR
  Filled 2020-11-16: qty 2

## 2020-11-16 MED ORDER — IBUPROFEN 600 MG PO TABS
600.0000 mg | ORAL_TABLET | Freq: Once | ORAL | Status: AC
  Administered 2020-11-16: 600 mg via ORAL
  Filled 2020-11-16: qty 1

## 2020-11-16 NOTE — MAU Note (Addendum)
Pt reports to mau for follow up hcg.  Reports some brown/red bleeding over last few days. No bleeding this morning. Reports her cramps have continued since leaving 2 days ago.

## 2020-11-16 NOTE — MAU Provider Note (Signed)
Ms. JORDAIN RADIN  is a 20 y.o. G3P2002 at [redacted]w[redacted]d who presents to MAU today for follow-up quant hCG after 48 hours. The patient was seen in MAU on 11/13/20 and had quant hCG of 10,468 and US showed IUGS but no YS or FP. She endorses low abdominal cramping pain, which she took Tylenol for and helped. Also having brown vaginal bleeding when she wipes. Has not needed a pad until this am. Passing small clots.   OB History  Gravida Para Term Preterm AB Living  3 2 2  0 0 2  SAB IAB Ectopic Multiple Live Births  0 0 0 0 2    # Outcome Date GA Lbr Len/2nd Weight Sex Delivery Anes PTL Lv  3 Current           2 Term 11/28/19 [redacted]w[redacted]d  3209 g M Vag-Spont None  LIV     Birth Comments: wnl  1 Term 07/12/18 [redacted]w[redacted]d 05:10 / 06:20 2384 g F Vag-Spont None  LIV     Birth Comments: WNL     Past Medical History:  Diagnosis Date  . Anxiety   . Asthma   . Heart murmur    resolved by age [age]y/o.  [redacted]w[redacted]d Hyperthyroidism   . Migraine   . Premature baby    7 weeks early    ROS: + VB + pain  BP 114/64 (BP Location: Right Arm)   Pulse 88   Temp 98.3 F (36.8 C) (Oral)   Resp 16   Wt 52.3 kg   LMP 10/03/2020   SpO2 100%   BMI 19.20 kg/m   CONSTITUTIONAL: Well-developed, well-nourished female in no acute distress, appears comfortable.  MUSCULOSKELETAL: Normal range of motion.  CARDIOVASCULAR: Regular heart rate RESPIRATORY: Normal effort NEUROLOGICAL: Alert and oriented to person, place, and time.  SKIN: Not diaphoretic. No erythema. No pallor. PSYCH: Normal mood and affect. Normal behavior. Normal judgment and thought content.  Results for orders placed or performed during the hospital encounter of 11/16/20 (from the past 24 hour(s))  Rh IG workup (includes ABO/Rh)     Status: None (Preliminary result)   Collection Time: 11/16/20  8:15 AM  Result Value Ref Range   Gestational Age(Wks) 6    Unit Number 11/18/20    Blood Component Type RHIG    Unit division 00    Status of Unit ISSUED     Transfusion Status      OK TO TRANSFUSE Performed at [facility], [address] [place], [place]   hCG, quantitative, pregnancy     Status: Abnormal   Collection Time: 11/16/20  8:33 AM  Result Value Ref Range   hCG, Beta Chain, Quant, S 15,301 (H) <5 mIU/mL    MDM: Labs ordered and reviewed. Rhogam given. Abnormal rise in qchg, likely failed pregnancy. Consult with Dr. 11/18/20. Plan for 48 hr qhcg in office. Pt and SO notified of results and plan. All questions answered. Stable for discharge home. Dr. Jolayne Panther notified of presentation, clinical findings, and plan for f/u.   A: 1. Pregnancy, location unknown   2. Rh negative status during pregnancy in first trimester    P: Discharge home SAB/ectopic precautions discussed Patient will return for follow-up at [facility] on 11/18/20 Patient may return to MAU as needed or if her condition were to change or worsen   Allergies as of 11/16/2020      Reactions   Vicodin [hydrocodone-acetaminophen] Rash   Pt states she can take  Oxycodone      Medication List    TAKE these medications   acetaminophen 325 MG tablet Commonly known as: Tylenol Take 2 tablets (650 mg total) by mouth every 6 (six) hours as needed (for pain scale < 4).   levothyroxine 25 MCG tablet Commonly known as: SYNTHROID Take 25 mcg by mouth daily before breakfast.      Donette Larry, CNM 11/16/2020 11:39 AM

## 2020-11-16 NOTE — Discharge Instructions (Signed)
Vaginal Bleeding During Pregnancy, First Trimester A small amount of bleeding from the vagina is common during early pregnancy. This kind of bleeding is also called spotting. Sometimes the bleeding is normal and does not cause problems. At other times, though, bleeding may be a sign of something serious. Normal bleeding in pregnancy can happen:  When the fertilized egg attaches itself to your womb.  When blood vessels change because of the pregnancy.  When you have pelvic exams.  When you have sex. Abnormal bleeding can happen:  When you have an infection.  When you have growths in your womb. The growths are called polyps.  If you are having a miscarriage or at risk of having one.  If you have other problems in your pregnancy. Tell your doctor right away about any bleeding from your vagina. Follow these instructions at home: Watch your bleeding  Watch your condition for any changes. Let your doctor know if you are worried about something.  Try to know what causes your bleeding. Ask yourself these questions: ? Does the bleeding start on its own? ? Does the bleeding start after something is done, such as sex or a pelvic exam?  Use a diary to write the things you see about your bleeding. Write in your diary: ? If the bleeding flows freely without stopping, or if it starts and stops, and then starts again. ? If the bleeding is heavy or light. ? How many pads you use in a day and how much blood is in them.  Tell your doctor if you pass tissue. He or she may want to see it.   Activity  Follow your doctor's instructions about how active you can be. Ask what activities are safe for you.  Do not have sex or orgasms until your doctor says that this is safe.  If needed, make plans for someone to help with your normal activities. General instructions  Take over-the-counter and prescription medicines only as told by your doctor.  Do not take aspirin because it can cause  bleeding.  Do not use tampons.  Do not douche.  Keep all follow-up visits. Contact a doctor if:  You have vaginal bleeding at any time while you are pregnant.  You have cramps.  You have a fever or chills. Get help right away if:  You have very bad cramps in your back or belly (abdomen).  You pass large clots or a lot of tissue from your vagina.  Your bleeding gets worse.  You feel light-headed.  You feel weak.  You pass out (faint).  You have chills.  You are leaking fluid from your vagina.  You have a gush of fluid from your vagina. Summary  Sometimes vaginal bleeding during pregnancy is normal and does not cause problems. At other times, bleeding may be a sign of something serious.  Tell your doctor right away about any bleeding from your vagina.  Follow your doctor's instructions about how active you can be. You may need someone to help you with your normal activities.  Keep all follow-up visits. This information is not intended to replace advice given to you by your health care provider. Make sure you discuss any questions you have with your health care provider. Document Revised: 03/13/2020 Document Reviewed: 03/13/2020 Elsevier Patient Education  2021 [employer] Inc.  

## 2020-11-17 LAB — RH IG WORKUP (INCLUDES ABO/RH)
Gestational Age(Wks): 6
Unit division: 0

## 2020-12-04 LAB — OB RESULTS CONSOLE HEPATITIS B SURFACE ANTIGEN: Hepatitis B Surface Ag: NEGATIVE

## 2020-12-04 LAB — HEPATITIS C ANTIBODY: HCV Ab: NEGATIVE

## 2020-12-04 LAB — OB RESULTS CONSOLE RUBELLA ANTIBODY, IGM: Rubella: NON-IMMUNE/NOT IMMUNE

## 2020-12-04 LAB — OB RESULTS CONSOLE HIV ANTIBODY (ROUTINE TESTING): HIV: NONREACTIVE

## 2020-12-04 LAB — OB RESULTS CONSOLE RPR: RPR: NONREACTIVE

## 2021-01-01 ENCOUNTER — Inpatient Hospital Stay (HOSPITAL_COMMUNITY): Payer: Managed Care, Other (non HMO)

## 2021-01-01 ENCOUNTER — Encounter (HOSPITAL_COMMUNITY): Payer: Self-pay | Admitting: Obstetrics and Gynecology

## 2021-01-01 ENCOUNTER — Inpatient Hospital Stay (HOSPITAL_COMMUNITY)
Admission: AD | Admit: 2021-01-01 | Discharge: 2021-01-01 | Disposition: A | Discharge status: Home / Self Care | Payer: Managed Care, Other (non HMO) | Attending: Obstetrics and Gynecology | Admitting: Obstetrics and Gynecology

## 2021-01-01 ENCOUNTER — Other Ambulatory Visit: Payer: Self-pay

## 2021-01-01 DIAGNOSIS — R109 Unspecified abdominal pain: Secondary | ICD-10-CM | POA: Diagnosis not present

## 2021-01-01 DIAGNOSIS — O26891 Other specified pregnancy related conditions, first trimester: Secondary | ICD-10-CM | POA: Insufficient documentation

## 2021-01-01 DIAGNOSIS — M549 Dorsalgia, unspecified: Secondary | ICD-10-CM | POA: Insufficient documentation

## 2021-01-01 DIAGNOSIS — Z885 Allergy status to narcotic agent status: Secondary | ICD-10-CM | POA: Diagnosis not present

## 2021-01-01 DIAGNOSIS — Z3A12 12 weeks gestation of pregnancy: Secondary | ICD-10-CM | POA: Insufficient documentation

## 2021-01-01 LAB — URINALYSIS, ROUTINE W REFLEX MICROSCOPIC
Bilirubin Urine: NEGATIVE
Glucose, UA: NEGATIVE mg/dL
Hgb urine dipstick: NEGATIVE
Ketones, ur: NEGATIVE mg/dL
Leukocytes,Ua: NEGATIVE
Nitrite: NEGATIVE
Protein, ur: NEGATIVE mg/dL
Specific Gravity, Urine: 1.02 (ref 1.005–1.030)
pH: 7 (ref 5.0–8.0)

## 2021-01-01 LAB — COMPREHENSIVE METABOLIC PANEL
ALT: 14 U/L (ref 0–44)
AST: 20 U/L (ref 15–41)
Albumin: 3.7 g/dL (ref 3.5–5.0)
Alkaline Phosphatase: 52 U/L (ref 38–126)
Anion gap: 8 (ref 5–15)
BUN: 9 mg/dL (ref 6–20)
CO2: 24 mmol/L (ref 22–32)
Calcium: 8.8 mg/dL — ABNORMAL LOW (ref 8.9–10.3)
Chloride: 103 mmol/L (ref 98–111)
Creatinine, Ser: 0.63 mg/dL (ref 0.44–1.00)
GFR, Estimated: >60 mL/min (ref >60)
Glucose, Bld: 91 mg/dL (ref 70–99)
Potassium: 3.7 mmol/L (ref 3.5–5.1)
Sodium: 135 mmol/L (ref 135–145)
Total Bilirubin: 0.3 mg/dL (ref 0.3–1.2)
Total Protein: 6.5 g/dL (ref 6.5–8.1)

## 2021-01-01 LAB — WET PREP, GENITAL
Clue Cells Wet Prep HPF POC: NONE SEEN
Sperm: NONE SEEN
Trich, Wet Prep: NONE SEEN
Yeast Wet Prep HPF POC: NONE SEEN

## 2021-01-01 LAB — CBC
HCT: 35.7 % — ABNORMAL LOW (ref 36.0–46.0)
Hemoglobin: 11.9 g/dL — ABNORMAL LOW (ref 12.0–15.0)
MCH: 28.6 pg (ref 26.0–34.0)
MCHC: 33.3 g/dL (ref 30.0–36.0)
MCV: 85.8 fL (ref 80.0–100.0)
Platelets: 228 10*3/uL (ref 150–400)
RBC: 4.16 MIL/uL (ref 3.87–5.11)
RDW: 14.1 % (ref 11.5–15.5)
WBC: 7.6 10*3/uL (ref 4.0–10.5)
nRBC: 0 % (ref 0.0–0.2)

## 2021-01-01 NOTE — MAU Note (Signed)
Presents with c/o pain in lower abdomen and back that began yesterday.  States pain has worsened since waiting to be seen in MAU.  Also reports tingling in hands and states eyes "spacing out"  and has difficulty breathing.

## 2021-01-01 NOTE — MAU Provider Note (Signed)
History     CSN: [id number]  Arrival date and time: 01/01/21 1222   Event Date/Time   First Provider Initiated Contact with Patient 01/01/21 1405      Chief Complaint  Patient presents with   Abdominal Pain   20 y.o. [id number] @12 .6 wks presenting with abdominal and back pain. Pain started yesterday. Pain is intermittent in her abdomen and constant in her back. Back pain is lower and bilateral. Abdominal pain is lower and bilateral but radiates up to upper quadrants. Rates 8/10. She took Tylenol yesterday which helped some. Feels her abdomen get hard. When she gets the pain she has tingling  of her hands and feet, blurry vision, and SOB. Denies VB, spotting, and discharge. Denies urinary sx. Denies N/V. Reports decreased appetite today, had some sausage and 1 bottle of water.   OB History     Gravida  3   Para  2   Term  2   Preterm  0   AB  0   Living  2      SAB  0   IAB  0   Ectopic  0   Multiple  0   Live Births  2           Past Medical History:  Diagnosis Date   Anxiety    Asthma    Heart murmur    resolved by age [age]y/o.   Hyperthyroidism    Migraine    Premature baby    7 weeks early    Past Surgical History:  Procedure Laterality Date   FOOT SURGERY Bilateral 03-2015   Rods in both feet    Family History  Problem Relation Age of Onset   Anxiety disorder Mother    Depression Mother    ADD / ADHD Mother    Bipolar disorder Mother    Migraines Mother    Hearing loss Mother    Anxiety disorder Brother    Depression Brother    Hearing loss Brother    Bipolar disorder Paternal Aunt        3 paternal aunts bipolar dx and on disability   Suicidality Paternal Aunt        paternal aunt attempted suicide 2 times   Other Paternal Aunt        Hx of substance abuse   Cancer Paternal Aunt    ADD / ADHD Brother    Cancer Maternal Grandmother        ovarian uterine colon   Ovarian cancer Maternal Aunt    Cancer Paternal Uncle     Social  History   Tobacco Use   Smoking status: Never   Smokeless tobacco: Never  Vaping Use   Vaping Use: Never used  Substance Use Topics   Alcohol use: No   Drug use: No    Allergies:  Allergies  Allergen Reactions   Vicodin [Hydrocodone-Acetaminophen] Rash    Pt states she can take Oxycodone    No medications prior to admission.   Review of Systems  Constitutional:  Negative for fever.  Gastrointestinal:  Positive for abdominal pain. Negative for constipation, diarrhea, nausea and vomiting.  Genitourinary:  Negative for dysuria, frequency, vaginal bleeding and vaginal discharge.  Musculoskeletal:  Positive for back pain.  Physical Exam   Blood pressure 103/62, pulse 77, temperature 98.3 F (36.8 C), resp. rate 15, height 5' 5.5" (1.664 m), weight 53 kg, last menstrual period 10/03/2020, SpO2 100 %, unknown if currently breastfeeding.  Physical Exam Vitals  and nursing note reviewed. Exam conducted with a chaperone present.  Constitutional:      General: She is not in acute distress (appears comfortable).    Appearance: Normal appearance.  HENT:     Head: Normocephalic and atraumatic.  Cardiovascular:     Rate and Rhythm: Normal rate and regular rhythm.     Heart sounds: Normal heart sounds.  Pulmonary:     Effort: Pulmonary effort is normal. No respiratory distress.     Breath sounds: Normal breath sounds. No stridor. No wheezing, rhonchi or rales.  Abdominal:     General: Bowel sounds are normal. There is no distension.     Palpations: Abdomen is soft. There is no mass.     Tenderness: There is no abdominal tenderness. There is no right CVA tenderness, left CVA tenderness, guarding or rebound.     Hernia: No hernia is present.  Genitourinary:    Comments: VE: closed/long Musculoskeletal:        General: Normal range of motion.     Cervical back: Normal and normal range of motion.     Thoracic back: Normal.     Lumbar back: Normal.  Skin:    General: Skin is warm  and dry.  Neurological:     General: No focal deficit present.     Mental Status: She is alert and oriented to person, place, and time.  Psychiatric:        Mood and Affect: Mood is anxious.        Behavior: Behavior normal.  FHT 171  Limited bedside US: viable, active fetus, +cardiac activity, subj. nml AFV  Results for orders placed or performed during the hospital encounter of 01/01/21 (from the past 24 hour(s))  Urinalysis, Routine w reflex microscopic Urine, Clean Catch     Status: None   Collection Time: 01/01/21  1:34 PM  Result Value Ref Range   Color, Urine YELLOW YELLOW   APPearance CLEAR CLEAR   Specific Gravity, Urine 1.020 1.005 - 1.030   pH 7.0 5.0 - 8.0   Glucose, UA NEGATIVE NEGATIVE mg/dL   Hgb urine dipstick NEGATIVE NEGATIVE   Bilirubin Urine NEGATIVE NEGATIVE   Ketones, ur NEGATIVE NEGATIVE mg/dL   Protein, ur NEGATIVE NEGATIVE mg/dL   Nitrite NEGATIVE NEGATIVE   Leukocytes,Ua NEGATIVE NEGATIVE  Wet prep, genital     Status: Abnormal   Collection Time: 01/01/21  2:35 PM   Specimen: PATH Cytology Cervicovaginal Ancillary Only  Result Value Ref Range   Yeast Wet Prep HPF POC NONE SEEN NONE SEEN   Trich, Wet Prep NONE SEEN NONE SEEN   Clue Cells Wet Prep HPF POC NONE SEEN NONE SEEN   WBC, Wet Prep HPF POC MANY (A) NONE SEEN   Sperm NONE SEEN   CBC     Status: Abnormal   Collection Time: 01/01/21  2:43 PM  Result Value Ref Range   WBC 7.6 4.0 - 10.5 K/uL   RBC 4.16 3.87 - 5.11 MIL/uL   Hemoglobin 11.9 (L) 12.0 - 15.0 g/dL   HCT 62.9 (L) 52.8 - 41.3 %   MCV 85.8 80.0 - 100.0 fL   MCH 28.6 26.0 - 34.0 pg   MCHC 33.3 30.0 - 36.0 g/dL   RDW 24.4 01.0 - 27.2 %   Platelets 228 150 - 400 K/uL   nRBC 0.0 0.0 - 0.2 %  Comprehensive metabolic panel     Status: Abnormal   Collection Time: 01/01/21  2:43 PM  Result Value  Ref Range   Sodium 135 135 - 145 mmol/L   Potassium 3.7 3.5 - 5.1 mmol/L   Chloride 103 98 - 111 mmol/L   CO2 24 22 - 32 mmol/L   Glucose,  Bld 91 70 - 99 mg/dL   BUN 9 6 - 20 mg/dL   Creatinine, Ser 4.09 0.44 - 1.00 mg/dL   Calcium 8.8 (L) 8.9 - 10.3 mg/dL   Total Protein 6.5 6.5 - 8.1 g/dL   Albumin 3.7 3.5 - 5.0 g/dL   AST 20 15 - 41 U/L   ALT 14 0 - 44 U/L   Alkaline Phosphatase 52 38 - 126 U/L   Total Bilirubin 0.3 0.3 - 1.2 mg/dL   GFR, Estimated >73 >53 mL/min   Anion gap 8 5 - 15   US Renal  Result Date: 01/01/2021 CLINICAL DATA:  Back pain, pregnancy EXAM: RENAL / URINARY TRACT ULTRASOUND COMPLETE COMPARISON:  None. FINDINGS: Right Kidney: Renal measurements: 11 x 5.2 x 4.5 cm = volume: 135 mL. Echogenicity is within normal limits. No concerning renal mass, shadowing calculus or hydronephrosis. Left Kidney: Renal measurements: 10.7 x 5.2 x 4 cm = volume: 118 mL. Echogenicity is within normal limits. No concerning renal mass, shadowing calculus or hydronephrosis. Bladder: Appears normal for degree of bladder distention. Prevoid volume of 58 mL, postvoid residual of less than 1 cc. Other: None. IMPRESSION: Unremarkable urinary tract ultrasound. Electronically Signed   By: Kreg Shropshire M.D.   On: 01/01/2021 16:25    MAU Course  Procedures  MDM Labs and renal US ordered and reviewed. No signs of UTI, pyelo or stones. No signs of SAB or acute process. Pt reports pain improved since arrival. Suspect pain is MSK, discussed comfort measures.   Assessment and Plan   1. [redacted] weeks gestation of pregnancy   2. Musculoskeletal back pain   3. Abdominal pain during pregnancy in first trimester    Discharge home Follow up at [facility] as scheduled SAB precautions Tylenol prn Heating pad prn  Allergies as of 01/01/2021       Reactions   Vicodin [hydrocodone-acetaminophen] Rash   Pt states she can take Oxycodone        Medication List     TAKE these medications    acetaminophen 325 MG tablet Commonly known as: Tylenol Take 2 tablets (650 mg total) by mouth every 6 (six) hours as needed (for pain scale < 4).    levothyroxine 25 MCG tablet Commonly known as: SYNTHROID Take 25 mcg by mouth daily before breakfast.   multivitamin-prenatal 27-0.8 MG Tabs tablet Take 1 tablet by mouth daily at 12 noon.       Donette Larry, CNM 01/01/2021, 6:55 PM

## 2021-01-02 LAB — GC/CHLAMYDIA PROBE AMP (~~LOC~~) NOT AT ARMC
Chlamydia: NEGATIVE
Comment: NEGATIVE
Comment: NORMAL
Neisseria Gonorrhea: NEGATIVE

## 2021-04-17 ENCOUNTER — Other Ambulatory Visit: Payer: Self-pay

## 2021-04-17 ENCOUNTER — Encounter (HOSPITAL_COMMUNITY): Payer: Self-pay | Admitting: Obstetrics and Gynecology

## 2021-04-17 ENCOUNTER — Inpatient Hospital Stay (HOSPITAL_COMMUNITY)
Admission: AD | Admit: 2021-04-17 | Discharge: 2021-04-17 | Disposition: A | Discharge status: Home / Self Care | Payer: Managed Care, Other (non HMO) | Attending: Obstetrics and Gynecology | Admitting: Obstetrics and Gynecology

## 2021-04-17 DIAGNOSIS — Z0371 Encounter for suspected problem with amniotic cavity and membrane ruled out: Secondary | ICD-10-CM

## 2021-04-17 DIAGNOSIS — Z3402 Encounter for supervision of normal first pregnancy, second trimester: Secondary | ICD-10-CM | POA: Diagnosis not present

## 2021-04-17 DIAGNOSIS — Z79890 Hormone replacement therapy (postmenopausal): Secondary | ICD-10-CM | POA: Insufficient documentation

## 2021-04-17 DIAGNOSIS — Z3A27 27 weeks gestation of pregnancy: Secondary | ICD-10-CM | POA: Insufficient documentation

## 2021-04-17 LAB — WET PREP, GENITAL
Clue Cells Wet Prep HPF POC: NONE SEEN
Sperm: NONE SEEN
Trich, Wet Prep: NONE SEEN
Yeast Wet Prep HPF POC: NONE SEEN

## 2021-04-17 LAB — AMNISURE RUPTURE OF MEMBRANE (ROM) NOT AT ARMC: Amnisure ROM: NEGATIVE

## 2021-04-17 NOTE — MAU Provider Note (Signed)
History     CSN: [id number]  Arrival date and time: 04/17/21 1604   Event Date/Time   First Provider Initiated Contact with Patient 04/17/21 1716      Chief Complaint  Patient presents with   Contractions   Rupture of Membranes   HPI Jacqueline Meadows is a 20 y.o. G3P2002 at [redacted]w[redacted]d who presents with vaginal discharge & pressure. Symptoms started this afternoon around 1 pm. States she had a few gushes of watery discharge that saturated her underwear. States it did not smell like urine. Also reports some intermittent pelvic pressures that occurs at unknown frequency. Denies dysuria, vaginal bleeding, vaginal irritation, or recent intercourse. Reports good fetal movement.   OB History     Gravida  3   Para  2   Term  2   Preterm  0   AB  0   Living  2      SAB  0   IAB  0   Ectopic  0   Multiple  0   Live Births  2           Past Medical History:  Diagnosis Date   Anxiety    Hyperthyroidism    Migraine    Premature baby    7 weeks early    Past Surgical History:  Procedure Laterality Date   FOOT SURGERY Bilateral 03-2015   Rods in both feet    Family History  Problem Relation Age of Onset   Anxiety disorder Mother    Depression Mother    ADD / ADHD Mother    Bipolar disorder Mother    Migraines Mother    Hearing loss Mother    Anxiety disorder Brother    Depression Brother    Hearing loss Brother    Bipolar disorder Paternal Aunt        3 paternal aunts bipolar dx and on disability   Suicidality Paternal Aunt        paternal aunt attempted suicide 2 times   Other Paternal Aunt        Hx of substance abuse   Cancer Paternal Aunt    ADD / ADHD Brother    Cancer Maternal Grandmother        ovarian uterine colon   Ovarian cancer Maternal Aunt    Cancer Paternal Uncle     Social History   Tobacco Use   Smoking status: Never   Smokeless tobacco: Never  Vaping Use   Vaping Use: Never used  Substance Use Topics   Alcohol use: No    Drug use: No    Allergies:  Allergies  Allergen Reactions   Vicodin [Hydrocodone-Acetaminophen] Rash    Pt states she can take Oxycodone    Medications Prior to Admission  Medication Sig Dispense Refill Last Dose   acetaminophen (TYLENOL) 325 MG tablet Take 2 tablets (650 mg total) by mouth every 6 (six) hours as needed (for pain scale < 4). 60 tablet 0 04/16/2021   Prenatal Vit-Fe Fumarate-FA (MULTIVITAMIN-PRENATAL) 27-0.8 MG TABS tablet Take 1 tablet by mouth daily at 12 noon.   04/16/2021   levothyroxine (SYNTHROID, LEVOTHROID) 25 MCG tablet Take 25 mcg by mouth daily before breakfast.       Review of Systems  Constitutional: Negative.   Gastrointestinal: Negative.   Genitourinary:  Positive for pelvic pain and vaginal discharge. Negative for dysuria and vaginal bleeding.  Physical Exam   Blood pressure 109/65, pulse 89, temperature 98.5 F (36.9 C), temperature source  Oral, resp. rate 18, height 5' 5.5" (1.664 m), weight 59.2 kg, last menstrual period 10/03/2020, SpO2 98 %, unknown if currently breastfeeding.  Physical Exam Vitals and nursing note reviewed. Exam conducted with a chaperone present.  Constitutional:      Appearance: Normal appearance. She is not ill-appearing.  HENT:     Head: Normocephalic and atraumatic.  Eyes:     General: No scleral icterus. Pulmonary:     Effort: Pulmonary effort is normal. No respiratory distress.  Genitourinary:    Comments: Sterile Spec Exam: no blood. Pooling of watery white discharge. Cervix visually closed.  Skin:    General: Skin is warm and dry.  Neurological:     General: No focal deficit present.     Mental Status: She is alert.  Psychiatric:        Mood and Affect: Mood normal.        Behavior: Behavior normal.   NST:  Baseline: 145 bpm, Variability: Good {> 6 bpm), Accelerations: Reactive, Decelerations: Absent, and no contractions  MAU Course  Procedures Results for orders placed or performed during the hospital  encounter of 04/17/21 (from the past 24 hour(s))  Wet prep, genital     Status: Abnormal   Collection Time: 04/17/21  5:33 PM  Result Value Ref Range   Yeast Wet Prep HPF POC NONE SEEN NONE SEEN   Trich, Wet Prep NONE SEEN NONE SEEN   Clue Cells Wet Prep HPF POC NONE SEEN NONE SEEN   WBC, Wet Prep HPF POC MANY (A) NONE SEEN   Sperm NONE SEEN   Amnisure rupture of membrane (rom)not at [facility]     Status: None   Collection Time: 04/17/21  6:28 PM  Result Value Ref Range   Amnisure ROM NEGATIVE     MDM Patient presents with concern for PPROM. SSE performed and noted to have moderate amount of white watery discharge. Does not look like amniotic fluid in appearance. Wet prep negative. GC/CT pending. Fern & amnisure negative.   Assessment and Plan   1. Encounter for suspected PROM, with rupture of membranes not found   2. [redacted] weeks gestation of pregnancy    -PTL precautions -keep f/u with OB -GC/CT pending  Judeth Horn 04/17/2021, 5:16 PM

## 2021-04-17 NOTE — MAU Note (Signed)
Jacqueline Meadows is a 20 y.o. at [redacted]w[redacted]d here in MAU reporting: around 1300 had a gush of water that soaked her underwear. Has had a little bit more leaking. States no odor. Has been having some pressure since the leaking started and is also feeling some contractions. No bleeding. +FM  Onset of complaint: today  Pain score: 6/10  Vitals:   04/17/21 1624  BP: 120/67  Pulse: 95  Resp: 16  Temp: 98.5 F (36.9 C)  SpO2: 97%     FHT:158  Lab orders placed from triage: none

## 2021-04-17 NOTE — Progress Notes (Signed)
Fetus active, not tracing well

## 2021-04-20 LAB — GC/CHLAMYDIA PROBE AMP (~~LOC~~) NOT AT ARMC
Chlamydia: NEGATIVE
Comment: NEGATIVE
Comment: NORMAL
Neisseria Gonorrhea: NEGATIVE

## 2021-05-29 ENCOUNTER — Inpatient Hospital Stay (HOSPITAL_COMMUNITY): Payer: Managed Care, Other (non HMO)

## 2021-05-29 ENCOUNTER — Inpatient Hospital Stay (HOSPITAL_BASED_OUTPATIENT_CLINIC_OR_DEPARTMENT_OTHER): Payer: Managed Care, Other (non HMO)

## 2021-05-29 ENCOUNTER — Encounter (HOSPITAL_COMMUNITY): Payer: Self-pay | Admitting: Obstetrics and Gynecology

## 2021-05-29 ENCOUNTER — Other Ambulatory Visit: Payer: Self-pay

## 2021-05-29 ENCOUNTER — Inpatient Hospital Stay (HOSPITAL_COMMUNITY)
Admission: AD | Admit: 2021-05-29 | Discharge: 2021-05-29 | Disposition: A | Discharge status: Home / Self Care | Payer: Managed Care, Other (non HMO) | Attending: Obstetrics and Gynecology | Admitting: Obstetrics and Gynecology

## 2021-05-29 DIAGNOSIS — N1330 Unspecified hydronephrosis: Secondary | ICD-10-CM | POA: Diagnosis not present

## 2021-05-29 DIAGNOSIS — R319 Hematuria, unspecified: Secondary | ICD-10-CM | POA: Diagnosis not present

## 2021-05-29 DIAGNOSIS — Z3A33 33 weeks gestation of pregnancy: Secondary | ICD-10-CM

## 2021-05-29 DIAGNOSIS — O26899 Other specified pregnancy related conditions, unspecified trimester: Secondary | ICD-10-CM

## 2021-05-29 DIAGNOSIS — O99891 Other specified diseases and conditions complicating pregnancy: Secondary | ICD-10-CM | POA: Diagnosis not present

## 2021-05-29 DIAGNOSIS — R109 Unspecified abdominal pain: Secondary | ICD-10-CM

## 2021-05-29 DIAGNOSIS — O4703 False labor before 37 completed weeks of gestation, third trimester: Secondary | ICD-10-CM | POA: Diagnosis not present

## 2021-05-29 DIAGNOSIS — O26893 Other specified pregnancy related conditions, third trimester: Secondary | ICD-10-CM | POA: Insufficient documentation

## 2021-05-29 LAB — COMPREHENSIVE METABOLIC PANEL
ALT: 16 U/L (ref 0–44)
AST: 22 U/L (ref 15–41)
Albumin: 2.7 g/dL — ABNORMAL LOW (ref 3.5–5.0)
Alkaline Phosphatase: 181 U/L — ABNORMAL HIGH (ref 38–126)
Anion gap: 9 (ref 5–15)
BUN: 5 mg/dL — ABNORMAL LOW (ref 6–20)
CO2: 20 mmol/L — ABNORMAL LOW (ref 22–32)
Calcium: 8.6 mg/dL — ABNORMAL LOW (ref 8.9–10.3)
Chloride: 105 mmol/L (ref 98–111)
Creatinine, Ser: 0.74 mg/dL (ref 0.44–1.00)
GFR, Estimated: >60 mL/min (ref >60)
Glucose, Bld: 127 mg/dL — ABNORMAL HIGH (ref 70–99)
Potassium: 3.3 mmol/L — ABNORMAL LOW (ref 3.5–5.1)
Sodium: 134 mmol/L — ABNORMAL LOW (ref 135–145)
Total Bilirubin: 0.4 mg/dL (ref 0.3–1.2)
Total Protein: 7 g/dL (ref 6.5–8.1)

## 2021-05-29 LAB — URINALYSIS, ROUTINE W REFLEX MICROSCOPIC
Bilirubin Urine: NEGATIVE
Glucose, UA: NEGATIVE mg/dL
Ketones, ur: NEGATIVE mg/dL
Nitrite: NEGATIVE
Protein, ur: NEGATIVE mg/dL
Specific Gravity, Urine: 1.002 — ABNORMAL LOW (ref 1.005–1.030)
pH: 7 (ref 5.0–8.0)

## 2021-05-29 LAB — CBC WITH DIFFERENTIAL/PLATELET
Abs Immature Granulocytes: 0.12 10*3/uL — ABNORMAL HIGH (ref 0.00–0.07)
Basophils Absolute: 0 10*3/uL (ref 0.0–0.1)
Basophils Relative: 0 %
Eosinophils Absolute: 0.1 10*3/uL (ref 0.0–0.5)
Eosinophils Relative: 1 %
HCT: 32.6 % — ABNORMAL LOW (ref 36.0–46.0)
Hemoglobin: 10.7 g/dL — ABNORMAL LOW (ref 12.0–15.0)
Immature Granulocytes: 1 %
Lymphocytes Relative: 29 %
Lymphs Abs: 3.7 10*3/uL (ref 0.7–4.0)
MCH: 27.6 pg (ref 26.0–34.0)
MCHC: 32.8 g/dL (ref 30.0–36.0)
MCV: 84 fL (ref 80.0–100.0)
Monocytes Absolute: 0.6 10*3/uL (ref 0.1–1.0)
Monocytes Relative: 5 %
Neutro Abs: 8.1 10*3/uL — ABNORMAL HIGH (ref 1.7–7.7)
Neutrophils Relative %: 64 %
Platelets: 278 10*3/uL (ref 150–400)
RBC: 3.88 MIL/uL (ref 3.87–5.11)
RDW: 13.1 % (ref 11.5–15.5)
WBC: 12.7 10*3/uL — ABNORMAL HIGH (ref 4.0–10.5)
nRBC: 0 % (ref 0.0–0.2)

## 2021-05-29 MED ORDER — FENTANYL CITRATE (PF) 100 MCG/2ML IJ SOLN
50.0000 ug | Freq: Once | INTRAMUSCULAR | Status: AC
  Administered 2021-05-29: 50 ug via INTRAVENOUS
  Filled 2021-05-29: qty 2

## 2021-05-29 MED ORDER — LACTATED RINGERS IV BOLUS
1000.0000 mL | Freq: Once | INTRAVENOUS | Status: AC
  Administered 2021-05-29: 1000 mL via INTRAVENOUS

## 2021-05-29 MED ORDER — NIFEDIPINE 10 MG PO CAPS
10.0000 mg | ORAL_CAPSULE | Freq: Once | ORAL | Status: AC
  Administered 2021-05-29: 10 mg via ORAL
  Filled 2021-05-29: qty 1

## 2021-05-29 MED ORDER — NIFEDIPINE 10 MG PO CAPS
10.0000 mg | ORAL_CAPSULE | ORAL | Status: DC | PRN
  Administered 2021-05-29 (×2): 10 mg via ORAL
  Filled 2021-05-29 (×2): qty 1

## 2021-05-29 MED ORDER — BETAMETHASONE SOD PHOS & ACET 6 (3-3) MG/ML IJ SUSP
12.0000 mg | Freq: Once | INTRAMUSCULAR | Status: AC
  Administered 2021-05-29: 12 mg via INTRAMUSCULAR
  Filled 2021-05-29: qty 5

## 2021-05-29 MED ORDER — TERBUTALINE SULFATE 1 MG/ML IJ SOLN
0.2500 mg | Freq: Once | INTRAMUSCULAR | Status: AC
  Administered 2021-05-29: 0.25 mg via SUBCUTANEOUS
  Filled 2021-05-29: qty 1

## 2021-05-29 NOTE — MAU Provider Note (Addendum)
History     CSN: [id number]  Arrival date and time: 05/29/21 0451   Event Date/Time   First Provider Initiated Contact with Patient 05/29/21 [phone]      Chief Complaint  Patient presents with   Contractions   HPI  Jacqueline Meadows is a 20 y.o. female G3P2002 @[redacted]w[redacted]d  here in MAU with contractions. The contractions started at 0322 and have been coming every 1-2 minutes. She reports 10/10 contraction pain. She reports off and on contractions for the last few weeks, however these are stronger.  Nothing helps the pain. No bleeding. + fetal movement.   Last intercourse was yesterday.   OB History     Gravida  3   Para  2   Term  2   Preterm  0   AB  0   Living  2      SAB  0   IAB  0   Ectopic  0   Multiple  0   Live Births  2           Past Medical History:  Diagnosis Date   Anxiety    Hyperthyroidism    Migraine    Premature baby    7 weeks early    Past Surgical History:  Procedure Laterality Date   FOOT SURGERY Bilateral 03-2015   Rods in both feet    Family History  Problem Relation Age of Onset   Anxiety disorder Mother    Depression Mother    ADD / ADHD Mother    Bipolar disorder Mother    Migraines Mother    Hearing loss Mother    Anxiety disorder Brother    Depression Brother    Hearing loss Brother    Bipolar disorder Paternal Aunt        3 paternal aunts bipolar dx and on disability   Suicidality Paternal Aunt        paternal aunt attempted suicide 2 times   Other Paternal Aunt        Hx of substance abuse   Cancer Paternal Aunt    ADD / ADHD Brother    Cancer Maternal Grandmother        ovarian uterine colon   Ovarian cancer Maternal Aunt    Cancer Paternal Uncle     Social History   Tobacco Use   Smoking status: Never   Smokeless tobacco: Never  Vaping Use   Vaping Use: Never used  Substance Use Topics   Alcohol use: No   Drug use: No    Allergies:  Allergies  Allergen Reactions   Vicodin  [Hydrocodone-Acetaminophen] Rash    Pt states she can take Oxycodone    Medications Prior to Admission  Medication Sig Dispense Refill Last Dose   acetaminophen (TYLENOL) 325 MG tablet Take 2 tablets (650 mg total) by mouth every 6 (six) hours as needed (for pain scale < 4). 60 tablet 0    Prenatal Vit-Fe Fumarate-FA (MULTIVITAMIN-PRENATAL) 27-0.8 MG TABS tablet Take 1 tablet by mouth daily at 12 noon.      Results for orders placed or performed during the hospital encounter of 05/29/21 (from the past 48 hour(s))  CBC with Differential/Platelet     Status: Abnormal   Collection Time: 05/29/21  6:53 AM  Result Value Ref Range   WBC 12.7 (H) 4.0 - 10.5 K/uL   RBC 3.88 3.87 - 5.11 MIL/uL   Hemoglobin 10.7 (L) 12.0 - 15.0 g/dL   HCT 32.6 (L) 36.0 -  46.0 %   MCV 84.0 80.0 - 100.0 fL   MCH 27.6 26.0 - 34.0 pg   MCHC 32.8 30.0 - 36.0 g/dL   RDW 74.2 59.5 - 63.8 %   Platelets 278 150 - 400 K/uL   nRBC 0.0 0.0 - 0.2 %   Neutrophils Relative % 64 %   Neutro Abs 8.1 (H) 1.7 - 7.7 K/uL   Lymphocytes Relative 29 %   Lymphs Abs 3.7 0.7 - 4.0 K/uL   Monocytes Relative 5 %   Monocytes Absolute 0.6 0.1 - 1.0 K/uL   Eosinophils Relative 1 %   Eosinophils Absolute 0.1 0.0 - 0.5 K/uL   Basophils Relative 0 %   Basophils Absolute 0.0 0.0 - 0.1 K/uL   Immature Granulocytes 1 %   Abs Immature Granulocytes 0.12 (H) 0.00 - 0.07 K/uL    Comment: Performed at [facility], [address] [place], [place] [address]  Urinalysis, Routine w reflex microscopic Urine, Clean Catch     Status: Abnormal   Collection Time: 05/29/21  6:56 AM  Result Value Ref Range   Color, Urine STRAW (A) YELLOW   APPearance CLEAR CLEAR   Specific Gravity, Urine 1.002 (L) 1.005 - 1.030   pH 7.0 5.0 - 8.0   Glucose, UA NEGATIVE NEGATIVE mg/dL   Hgb urine dipstick MODERATE (A) NEGATIVE   Bilirubin Urine NEGATIVE NEGATIVE   Ketones, ur NEGATIVE NEGATIVE mg/dL   Protein, ur NEGATIVE NEGATIVE mg/dL   Nitrite NEGATIVE  NEGATIVE   Leukocytes,Ua SMALL (A) NEGATIVE   RBC / HPF 0-5 0 - 5 RBC/hpf   WBC, UA 6-10 0 - 5 WBC/hpf   Bacteria, UA RARE (A) NONE SEEN   Squamous Epithelial / LPF 0-5 0 - 5   Mucus PRESENT    Sperm, UA PRESENT     Comment: Performed at [facility], [address] [place], [place] [address]     Review of Systems  Gastrointestinal:  Positive for abdominal pain.  Genitourinary:  Negative for vaginal bleeding and vaginal discharge.  Physical Exam   Blood pressure 114/76, pulse (!) 128, temperature 98.8 F (37.1 C), temperature source Oral, resp. rate 20, height 5\' 5"  (1.651 m), weight 62.3 kg, last menstrual period 10/03/2020, SpO2 100 %, unknown if currently breastfeeding.  Physical Exam Constitutional:      General: She is not in acute distress. HENT:     Head: Normocephalic.  Abdominal:     Palpations: Abdomen is soft.     Tenderness: There is generalized abdominal tenderness. There is no guarding or rebound.  Genitourinary:    Comments: Dilation: 1.5 Effacement (%): 60 Station: -3 Presentation: Vertex Exam by:: [email or web address], NP  Skin:    General: Skin is warm.  Neurological:     Mental Status: She is alert and oriented to person, place, and time.  Psychiatric:        Behavior: Behavior normal.   Fetal Tracing: Baseline: 145 bpm Variability: Moderate  Accelerations: 15x15 Decelerations: none Toco: Q 3-4  MAU Course  Procedures Results for orders placed or performed during the hospital encounter of 05/29/21 (from the past 24 hour(s))  CBC with Differential/Platelet     Status: Abnormal   Collection Time: 05/29/21  6:53 AM  Result Value Ref Range   WBC 12.7 (H) 4.0 - 10.5 K/uL   RBC 3.88 3.87 - 5.11 MIL/uL   Hemoglobin 10.7 (L) 12.0 - 15.0 g/dL   HCT 05/31/21 (L) 88.4 - 16.6 %  MCV 84.0 80.0 - 100.0 fL   MCH 27.6 26.0 - 34.0 pg   MCHC 32.8 30.0 - 36.0 g/dL   RDW 13.1 11.5 - 15.5 %   Platelets 278 150 - 400 K/uL   nRBC 0.0 0.0 - 0.2 %   Neutrophils  Relative % 64 %   Neutro Abs 8.1 (H) 1.7 - 7.7 K/uL   Lymphocytes Relative 29 %   Lymphs Abs 3.7 0.7 - 4.0 K/uL   Monocytes Relative 5 %   Monocytes Absolute 0.6 0.1 - 1.0 K/uL   Eosinophils Relative 1 %   Eosinophils Absolute 0.1 0.0 - 0.5 K/uL   Basophils Relative 0 %   Basophils Absolute 0.0 0.0 - 0.1 K/uL   Immature Granulocytes 1 %   Abs Immature Granulocytes 0.12 (H) 0.00 - 0.07 K/uL  Comprehensive metabolic panel     Status: Abnormal   Collection Time: 05/29/21  6:53 AM  Result Value Ref Range   Sodium 134 (L) 135 - 145 mmol/L   Potassium 3.3 (L) 3.5 - 5.1 mmol/L   Chloride 105 98 - 111 mmol/L   CO2 20 (L) 22 - 32 mmol/L   Glucose, Bld 127 (H) 70 - 99 mg/dL   BUN 5 (L) 6 - 20 mg/dL   Creatinine, Ser 0.74 0.44 - 1.00 mg/dL   Calcium 8.6 (L) 8.9 - 10.3 mg/dL   Total Protein 7.0 6.5 - 8.1 g/dL   Albumin 2.7 (L) 3.5 - 5.0 g/dL   AST 22 15 - 41 U/L   ALT 16 0 - 44 U/L   Alkaline Phosphatase 181 (H) 38 - 126 U/L   Total Bilirubin 0.4 0.3 - 1.2 mg/dL   GFR, Estimated >60 >60 mL/min   Anion gap 9 5 - 15  Urinalysis, Routine w reflex microscopic Urine, Clean Catch     Status: Abnormal   Collection Time: 05/29/21  6:56 AM  Result Value Ref Range   Color, Urine STRAW (A) YELLOW   APPearance CLEAR CLEAR   Specific Gravity, Urine 1.002 (L) 1.005 - 1.030   pH 7.0 5.0 - 8.0   Glucose, UA NEGATIVE NEGATIVE mg/dL   Hgb urine dipstick MODERATE (A) NEGATIVE   Bilirubin Urine NEGATIVE NEGATIVE   Ketones, ur NEGATIVE NEGATIVE mg/dL   Protein, ur NEGATIVE NEGATIVE mg/dL   Nitrite NEGATIVE NEGATIVE   Leukocytes,Ua SMALL (A) NEGATIVE   RBC / HPF 0-5 0 - 5 RBC/hpf   WBC, UA 6-10 0 - 5 WBC/hpf   Bacteria, UA RARE (A) NONE SEEN   Squamous Epithelial / LPF 0-5 0 - 5   Mucus PRESENT    Sperm, UA PRESENT     US RENAL  Result Date: 05/29/2021 CLINICAL DATA:  Thirty-three weeks pregnant.  Hematuria. EXAM: RENAL / URINARY TRACT ULTRASOUND COMPLETE COMPARISON:  Renal ultrasound 01/01/2021  FINDINGS: Right Kidney: Renal measurements: 12.9 x 5.9 x 7.2 cm = volume: 287 mL. Moderate right hydronephrosis. No stone or mass identified. Left Kidney: Renal measurements: 12.7 x 6.0 x 3.8 cm = volume: 152 mL. Echogenicity within normal limits. No mass or hydronephrosis visualized. Bladder: Recent voiding. Other: None. IMPRESSION: Moderate right hydronephrosis. Normal left kidney Electronically Signed   By: Franchot Gallo M.D.   On: 05/29/2021 08:30     MDM  Unable to collect FFN at this time d/t recent intercourse LR bolus x 1 BMZ given x1 Procardia 10 mg PO & Fentanyl 50 mcg X 1 RN called around 0630 and reports patient continues to have  10/10 contractions pain. 50 additional mcg of IV fentanyl given, terbutaline sub Q. NP to bedside. Cervix rechecked at 0645 and remains unchanged. Cervix is extremely posterior.  Bedside US ordered  0740: Patient feels pain has mildly improved, however still believes this pain is contraction/labor pain. Abdomen is soft with continued generalized tenderness.  Given the amount of pain will send for renal US to evaluated for kidney stone.  Report given to Len Blalock CNM who resumes care of the patient.   Rasch, Artist Pais, NP  Moderate right hydronephrosis noted but not stone or mass identified Patient still contracting every 5 minutes after ultrasound.  Procardia series x3  After 2 doses, resolution of contractions and pain. Cervix rechecked and unchanged from previous 2 exams. Reassurance provided that this is not labor and is likely related to recent intercourse. Encouraged patient to wear pregnancy support belt regularly and labor precautions reviewed.   Assessment and Plan   1. Preterm uterine contractions in third trimester, antepartum   2. Abdominal pain affecting pregnancy   3. [redacted] weeks gestation of pregnancy   4. Hematuria    -Discharge home in stable condition -Preterm labor precautions discussed -Patient advised to follow-up with OB  as scheduled for prenatal care -Patient may return to MAU as needed or if her condition were to change or worsen  Wende Mott, CNM 05/29/21 1:24 PM

## 2021-05-29 NOTE — Discharge Instructions (Signed)

## 2021-05-29 NOTE — MAU Note (Signed)
Pt reports to MAU with ctx every 1-2 mins since 322 this am. Pt reports pain rating 10/10. Pt has been having ctx for the last few weeks and pt  OB is aware, however, tonight they got worst. Pt reports some streak of VB the last few weeks.Pt denies DFM, LOF, and abnormal discharge.

## 2021-05-29 NOTE — MAU Note (Signed)
MyChart message sent by RN.  Please return to MAU tomorrow on 05/30/2021 around 0900 for your second dose of Betamethasone.

## 2021-05-30 ENCOUNTER — Other Ambulatory Visit: Payer: Self-pay

## 2021-05-30 ENCOUNTER — Inpatient Hospital Stay (HOSPITAL_COMMUNITY)
Admission: AD | Admit: 2021-05-30 | Discharge: 2021-05-30 | Disposition: A | Discharge status: Home / Self Care | Payer: Managed Care, Other (non HMO) | Attending: Obstetrics and Gynecology | Admitting: Obstetrics and Gynecology

## 2021-05-30 ENCOUNTER — Encounter (HOSPITAL_COMMUNITY): Payer: Self-pay | Admitting: Obstetrics and Gynecology

## 2021-05-30 DIAGNOSIS — Z3A33 33 weeks gestation of pregnancy: Secondary | ICD-10-CM | POA: Diagnosis not present

## 2021-05-30 DIAGNOSIS — M545 Low back pain, unspecified: Secondary | ICD-10-CM | POA: Diagnosis not present

## 2021-05-30 DIAGNOSIS — O4703 False labor before 37 completed weeks of gestation, third trimester: Secondary | ICD-10-CM | POA: Insufficient documentation

## 2021-05-30 DIAGNOSIS — O26893 Other specified pregnancy related conditions, third trimester: Secondary | ICD-10-CM | POA: Diagnosis not present

## 2021-05-30 DIAGNOSIS — Z3689 Encounter for other specified antenatal screening: Secondary | ICD-10-CM

## 2021-05-30 LAB — URINALYSIS, ROUTINE W REFLEX MICROSCOPIC
Bacteria, UA: NONE SEEN
Bilirubin Urine: NEGATIVE
Glucose, UA: NEGATIVE mg/dL
Hgb urine dipstick: NEGATIVE
Ketones, ur: NEGATIVE mg/dL
Nitrite: NEGATIVE
Protein, ur: NEGATIVE mg/dL
Specific Gravity, Urine: 1.014 (ref 1.005–1.030)
pH: 6 (ref 5.0–8.0)

## 2021-05-30 MED ORDER — BETAMETHASONE SOD PHOS & ACET 6 (3-3) MG/ML IJ SUSP
12.0000 mg | Freq: Once | INTRAMUSCULAR | Status: AC
  Administered 2021-05-30: 12 mg via INTRAMUSCULAR

## 2021-05-30 NOTE — MAU Provider Note (Signed)
History     CSN: [id number]  Arrival date and time: 05/30/21 6073   Event Date/Time   First Provider Initiated Contact with Patient 05/30/21 1036      Chief Complaint  Patient presents with   Injections   HPI Jacqueline Meadows is a 20 y.o. G3P2002 at [redacted]w[redacted]d who presents to MAU for her second betamethasone injection. On MSE in MAU triage she reports that she "was contracting and bleeding all night last night". She endorses seeing small clots in her discharge throughout the night. On arrival to MAU she denies contractions and vaginal bleeding but endorses low back pain and "baby balling up". She denies aggravating or alleviating factors. She has not taken medication or tried other treatments for these complaints.  Patient receives care with [facility]. Her next appointment is Monday  OB History     Gravida  3   Para  2   Term  2   Preterm  0   AB  0   Living  2      SAB  0   IAB  0   Ectopic  0   Multiple  0   Live Births  2           Past Medical History:  Diagnosis Date   Anxiety    Hyperthyroidism    Migraine    Premature baby    7 weeks early    Past Surgical History:  Procedure Laterality Date   FOOT SURGERY Bilateral 03-2015   Rods in both feet    Family History  Problem Relation Age of Onset   Anxiety disorder Mother    Depression Mother    ADD / ADHD Mother    Bipolar disorder Mother    Migraines Mother    Hearing loss Mother    Anxiety disorder Brother    Depression Brother    Hearing loss Brother    Bipolar disorder Paternal Aunt        3 paternal aunts bipolar dx and on disability   Suicidality Paternal Aunt        paternal aunt attempted suicide 2 times   Other Paternal Aunt        Hx of substance abuse   Cancer Paternal Aunt    ADD / ADHD Brother    Cancer Maternal Grandmother        ovarian uterine colon   Ovarian cancer Maternal Aunt    Cancer Paternal Uncle     Social History   Tobacco Use   Smoking  status: Never   Smokeless tobacco: Never  Vaping Use   Vaping Use: Never used  Substance Use Topics   Alcohol use: No   Drug use: No    Allergies:  Allergies  Allergen Reactions   Vicodin [Hydrocodone-Acetaminophen] Rash    Pt states she can take Oxycodone    Medications Prior to Admission  Medication Sig Dispense Refill Last Dose   acetaminophen (TYLENOL) 325 MG tablet Take 2 tablets (650 mg total) by mouth every 6 (six) hours as needed (for pain scale < 4). 60 tablet 0    Prenatal Vit-Fe Fumarate-FA (MULTIVITAMIN-PRENATAL) 27-0.8 MG TABS tablet Take 1 tablet by mouth daily at 12 noon.       Review of Systems  Musculoskeletal:  Positive for back pain.  All other systems reviewed and are negative. Physical Exam   Blood pressure 115/73, pulse (!) 127, temperature 98 F (36.7 C), temperature source Oral, resp. rate 16, last  menstrual period 10/03/2020, SpO2 99 %, unknown if currently breastfeeding.  Physical Exam Vitals and nursing note reviewed. Exam conducted with a chaperone present.  Constitutional:      General: She is not in acute distress.    Appearance: Normal appearance. She is not ill-appearing.  Cardiovascular:     Rate and Rhythm: Normal rate and regular rhythm.     Pulses: Normal pulses.     Heart sounds: Normal heart sounds.  Pulmonary:     Effort: Pulmonary effort is normal.  Abdominal:     Comments: Gravid  Genitourinary:    General: Normal vulva.     Vagina: No vaginal discharge.  Skin:    Capillary Refill: Capillary refill takes less than 2 seconds.  Neurological:     Mental Status: She is alert and oriented to person, place, and time.    MAU Course/MDM  Procedures: speculum exam  --Cervix charted as 1.5cm yesterday --No bleeding on speculum exam today, cervix visually closed --Reactive tracing: baseline 145, mod var, + accels, no decels --Toco: quiet --Pt declined IV fluids, Flexeril for comfort, CBC to monitor bleeding, digital exam to  assess dilation  Patient Vitals for the past 24 hrs:  BP Temp Temp src Pulse Resp SpO2  05/30/21 1118 106/63 -- -- (!) 101 -- 98 %  05/30/21 1005 115/73 98 F (36.7 C) Oral (!) 127 16 99 %   Assessment and Plan  --20 y.o. G3P2002 at [redacted]w[redacted]d  --BMZ 2 of 2 given in MAU --Reactive tracing --Discharge home in stable condition  Calvert Cantor, CNM 05/30/2021, 3:43 PM

## 2021-05-30 NOTE — MAU Note (Signed)
Jacqueline Meadows is a 20 y.o. at [redacted]w[redacted]d here in MAU reporting: here for 2nd BMZ. But reports ongoing back pain and vaginal bleeding and some clots. States she was contracting all last night. No LOF. +FM  Onset of complaint: ongoing  Pain score: 7/10  Vitals:   05/30/21 1005  BP: 115/73  Pulse: (!) 127  Resp: 16  Temp: 98 F (36.7 C)  SpO2: 99%     FHT:EFM applied in room  Lab orders placed from triage: UA

## 2021-06-19 ENCOUNTER — Other Ambulatory Visit: Payer: Self-pay

## 2021-06-19 ENCOUNTER — Encounter (HOSPITAL_COMMUNITY): Payer: Self-pay | Admitting: Obstetrics and Gynecology

## 2021-06-19 ENCOUNTER — Inpatient Hospital Stay (HOSPITAL_COMMUNITY)
Admission: AD | Admit: 2021-06-19 | Discharge: 2021-06-21 | DRG: 806 | Disposition: A | Discharge status: Home / Self Care | Payer: Managed Care, Other (non HMO) | Attending: Obstetrics and Gynecology | Admitting: Obstetrics and Gynecology

## 2021-06-19 DIAGNOSIS — E039 Hypothyroidism, unspecified: Secondary | ICD-10-CM | POA: Diagnosis present

## 2021-06-19 DIAGNOSIS — D62 Acute posthemorrhagic anemia: Secondary | ICD-10-CM | POA: Diagnosis not present

## 2021-06-19 DIAGNOSIS — Z20822 Contact with and (suspected) exposure to covid-19: Secondary | ICD-10-CM | POA: Diagnosis present

## 2021-06-19 DIAGNOSIS — O99284 Endocrine, nutritional and metabolic diseases complicating childbirth: Secondary | ICD-10-CM | POA: Diagnosis present

## 2021-06-19 DIAGNOSIS — Z6791 Unspecified blood type, Rh negative: Secondary | ICD-10-CM

## 2021-06-19 DIAGNOSIS — O9081 Anemia of the puerperium: Secondary | ICD-10-CM | POA: Diagnosis not present

## 2021-06-19 DIAGNOSIS — O26893 Other specified pregnancy related conditions, third trimester: Secondary | ICD-10-CM | POA: Diagnosis present

## 2021-06-19 DIAGNOSIS — Z3A36 36 weeks gestation of pregnancy: Secondary | ICD-10-CM

## 2021-06-19 LAB — CBC
HCT: 33.5 % — ABNORMAL LOW (ref 36.0–46.0)
Hemoglobin: 10.6 g/dL — ABNORMAL LOW (ref 12.0–15.0)
MCH: 26.1 pg (ref 26.0–34.0)
MCHC: 31.6 g/dL (ref 30.0–36.0)
MCV: 82.5 fL (ref 80.0–100.0)
Platelets: 238 10*3/uL (ref 150–400)
RBC: 4.06 MIL/uL (ref 3.87–5.11)
RDW: 13.6 % (ref 11.5–15.5)
WBC: 10.4 10*3/uL (ref 4.0–10.5)
nRBC: 0 % (ref 0.0–0.2)

## 2021-06-19 LAB — RESP PANEL BY RT-PCR (FLU A&B, COVID) ARPGX2
Influenza A by PCR: NEGATIVE
Influenza B by PCR: NEGATIVE
SARS Coronavirus 2 by RT PCR: NEGATIVE

## 2021-06-19 LAB — GROUP B STREP BY PCR: Group B strep by PCR: NEGATIVE

## 2021-06-19 LAB — TYPE AND SCREEN
ABO/RH(D): A NEG
Antibody Screen: POSITIVE

## 2021-06-19 LAB — RPR: RPR Ser Ql: NONREACTIVE

## 2021-06-19 MED ORDER — LACTATED RINGERS IV SOLN: 500.0000 mL | INTRAVENOUS | Status: DC | PRN

## 2021-06-19 MED ORDER — ONDANSETRON HCL 4 MG/2ML IJ SOLN
4.0000 mg | Freq: Four times a day (QID) | INTRAMUSCULAR | Status: DC | PRN
  Administered 2021-06-19: 4 mg via INTRAVENOUS
  Filled 2021-06-19: qty 2

## 2021-06-19 MED ORDER — PENICILLIN G POT IN DEXTROSE 60000 UNIT/ML IV SOLN: 3.0000 10*6.[IU] | INTRAVENOUS | Status: DC

## 2021-06-19 MED ORDER — DIBUCAINE (PERIANAL) 1 % EX OINT: 1.0000 "application " | TOPICAL_OINTMENT | CUTANEOUS | Status: DC | PRN

## 2021-06-19 MED ORDER — FENTANYL CITRATE (PF) 100 MCG/2ML IJ SOLN
100.0000 ug | INTRAMUSCULAR | Status: DC | PRN
  Administered 2021-06-19 (×3): 100 ug via INTRAVENOUS
  Filled 2021-06-19 (×3): qty 2

## 2021-06-19 MED ORDER — ONDANSETRON HCL 4 MG/2ML IJ SOLN: 4.0000 mg | INTRAMUSCULAR | Status: DC | PRN

## 2021-06-19 MED ORDER — SODIUM CHLORIDE 0.9 % IV SOLN
5.0000 10*6.[IU] | Freq: Once | INTRAVENOUS | Status: AC
  Administered 2021-06-19: 5 10*6.[IU] via INTRAVENOUS
  Filled 2021-06-19: qty 5

## 2021-06-19 MED ORDER — ONDANSETRON HCL 4 MG PO TABS: 4.0000 mg | ORAL_TABLET | ORAL | Status: DC | PRN

## 2021-06-19 MED ORDER — ACETAMINOPHEN 325 MG PO TABS: 650.0000 mg | ORAL_TABLET | ORAL | Status: DC | PRN

## 2021-06-19 MED ORDER — LIDOCAINE HCL (PF) 1 % IJ SOLN: 30.0000 mL | INTRAMUSCULAR | Status: DC | PRN

## 2021-06-19 MED ORDER — COCONUT OIL OIL: 1.0000 "application " | TOPICAL_OIL | Status: DC | PRN

## 2021-06-19 MED ORDER — OXYTOCIN-SODIUM CHLORIDE 30-0.9 UT/500ML-% IV SOLN
2.5000 [IU]/h | INTRAVENOUS | Status: DC
  Filled 2021-06-19: qty 500

## 2021-06-19 MED ORDER — SIMETHICONE 80 MG PO CHEW: 80.0000 mg | CHEWABLE_TABLET | ORAL | Status: DC | PRN

## 2021-06-19 MED ORDER — WITCH HAZEL-GLYCERIN EX PADS: 1.0000 "application " | MEDICATED_PAD | CUTANEOUS | Status: DC | PRN

## 2021-06-19 MED ORDER — IBUPROFEN 600 MG PO TABS
600.0000 mg | ORAL_TABLET | Freq: Four times a day (QID) | ORAL | Status: DC
  Administered 2021-06-19 – 2021-06-21 (×8): 600 mg via ORAL
  Filled 2021-06-19 (×8): qty 1

## 2021-06-19 MED ORDER — TETANUS-DIPHTH-ACELL PERTUSSIS 5-2.5-18.5 LF-MCG/0.5 IM SUSY: 0.5000 mL | PREFILLED_SYRINGE | Freq: Once | INTRAMUSCULAR | Status: DC

## 2021-06-19 MED ORDER — LACTATED RINGERS IV SOLN: INTRAVENOUS | Status: DC

## 2021-06-19 MED ORDER — SOD CITRATE-CITRIC ACID 500-334 MG/5ML PO SOLN: 30.0000 mL | ORAL | Status: DC | PRN

## 2021-06-19 MED ORDER — ZOLPIDEM TARTRATE 5 MG PO TABS: 5.0000 mg | ORAL_TABLET | Freq: Every evening | ORAL | Status: DC | PRN

## 2021-06-19 MED ORDER — DIPHENHYDRAMINE HCL 25 MG PO CAPS: 25.0000 mg | ORAL_CAPSULE | Freq: Four times a day (QID) | ORAL | Status: DC | PRN

## 2021-06-19 MED ORDER — PRENATAL MULTIVITAMIN CH
1.0000 | ORAL_TABLET | Freq: Every day | ORAL | Status: DC
  Administered 2021-06-20 – 2021-06-21 (×2): 1 via ORAL
  Filled 2021-06-19 (×2): qty 1

## 2021-06-19 MED ORDER — LACTATED RINGERS IV BOLUS
1000.0000 mL | Freq: Once | INTRAVENOUS | Status: AC
  Administered 2021-06-19: 1000 mL via INTRAVENOUS

## 2021-06-19 MED ORDER — OXYTOCIN BOLUS FROM INFUSION
333.0000 mL | Freq: Once | INTRAVENOUS | Status: AC
  Administered 2021-06-19: 333 mL via INTRAVENOUS

## 2021-06-19 MED ORDER — BENZOCAINE-MENTHOL 20-0.5 % EX AERO
1.0000 "application " | INHALATION_SPRAY | CUTANEOUS | Status: DC | PRN
  Administered 2021-06-19: 1 via TOPICAL
  Filled 2021-06-19: qty 56

## 2021-06-19 MED ORDER — SENNOSIDES-DOCUSATE SODIUM 8.6-50 MG PO TABS
2.0000 | ORAL_TABLET | ORAL | Status: DC
  Administered 2021-06-20 – 2021-06-21 (×2): 2 via ORAL
  Filled 2021-06-19 (×2): qty 2

## 2021-06-19 MED ORDER — FENTANYL CITRATE (PF) 100 MCG/2ML IJ SOLN
100.0000 ug | Freq: Once | INTRAMUSCULAR | Status: AC
  Administered 2021-06-19: 100 ug via INTRAVENOUS
  Filled 2021-06-19: qty 2

## 2021-06-19 NOTE — MAU Note (Signed)
Jacqueline Meadows is a 20 y.o. at [redacted]w[redacted]d here in MAU reporting: Ctx that started around midnight. Felt leaking fluid around 2am. Feeling vaginal pressure LMP: 10/03/20 Onset of complaint: 12am Pain score: 8/10 Vitals:   06/19/21 0345  BP: 128/78  Pulse: 96  Resp: 18  Temp: 98.1 F (36.7 C)  SpO2: 98%     FHT:155 Lab orders placed from triage:

## 2021-06-19 NOTE — Progress Notes (Signed)
Cervix 8 /70-80/-2 with bulging bag.  AROM completed in standard fashion, head well applied.    Patient uncomfortable with increasing pressure. She has history of fast progression and is planning to remain unblocked.    FHT cat 1, anticipate vaginal delivery.

## 2021-06-19 NOTE — MAU Provider Note (Signed)
Chief Complaint:  Contractions and Rupture of Membranes   Event Date/Time   First Provider Initiated Contact with Patient 06/19/21 0435      HPI: Jacqueline Meadows is a 20 y.o. G3P2002 at [redacted]w[redacted]d who presents to maternity admissions reporting painful contractions and leaking fluid enough to feel it trickle out and soak a pantyliner.  She used medication for yeast infection vaginally last night. She denies recent intercourse. She reports good fetal movement.    Location: low abdomen\ vaginal pain Quality: cramping, pressure Severity: 8/10 on pain scale Duration: 4-5 hours Timing: intermittent Modifying factors: none Associated signs and symptoms: leaking fluid  HPI  Past Medical History: Past Medical History:  Diagnosis Date   Anxiety    Hyperthyroidism    Migraine    Premature baby    7 weeks early    Past obstetric history: OB History  Gravida Para Term Preterm AB Living  3 2 2  0 0 2  SAB IAB Ectopic Multiple Live Births  0 0 0 0 2    # Outcome Date GA Lbr Len/2nd Weight Sex Delivery Anes PTL Lv  3 Current           2 Term 11/28/19 [redacted]w[redacted]d  3209 g M Vag-Spont None  LIV     Birth Comments: wnl  1 Term 07/12/18 [redacted]w[redacted]d 05:10 / 06:20 2384 g F Vag-Spont None  LIV     Birth Comments: WNL     Past Surgical History: Past Surgical History:  Procedure Laterality Date   FOOT SURGERY Bilateral 03-2015   Rods in both feet    Family History: Family History  Problem Relation Age of Onset   Anxiety disorder Mother    Depression Mother    ADD / ADHD Mother    Bipolar disorder Mother    Migraines Mother    Hearing loss Mother    Anxiety disorder Brother    Depression Brother    Hearing loss Brother    Bipolar disorder Paternal Aunt        3 paternal aunts bipolar dx and on disability   Suicidality Paternal Aunt        paternal aunt attempted suicide 2 times   Other Paternal Aunt        Hx of substance abuse   Cancer Paternal Aunt    ADD / ADHD Brother    Cancer  Maternal Grandmother        ovarian uterine colon   Ovarian cancer Maternal Aunt    Cancer Paternal Uncle     Social History: Social History   Tobacco Use   Smoking status: Never   Smokeless tobacco: Never  Vaping Use   Vaping Use: Never used  Substance Use Topics   Alcohol use: No   Drug use: No    Allergies:  Allergies  Allergen Reactions   Vicodin [Hydrocodone-Acetaminophen] Rash    Pt states she can take Oxycodone    Meds:  Medications Prior to Admission  Medication Sig Dispense Refill Last Dose   acetaminophen (TYLENOL) 325 MG tablet Take 2 tablets (650 mg total) by mouth every 6 (six) hours as needed (for pain scale < 4). 60 tablet 0 06/18/2021   Prenatal Vit-Fe Fumarate-FA (MULTIVITAMIN-PRENATAL) 27-0.8 MG TABS tablet Take 1 tablet by mouth daily at 12 noon.   06/19/2021    ROS:  Review of Systems  Constitutional:  Negative for chills, fatigue and fever.  Eyes:  Negative for visual disturbance.  Respiratory:  Negative for shortness of  breath.   Cardiovascular:  Negative for chest pain.  Gastrointestinal:  Positive for abdominal pain. Negative for nausea and vomiting.  Genitourinary:  Positive for vaginal discharge. Negative for difficulty urinating, dysuria, flank pain, pelvic pain, vaginal bleeding and vaginal pain.  Neurological:  Negative for dizziness and headaches.  Psychiatric/Behavioral: Negative.      I have reviewed patient's Past Medical Hx, Surgical Hx, Family Hx, Social Hx, medications and allergies.   Physical Exam  Patient Vitals for the past 24 hrs:  BP Temp Temp src Pulse Resp SpO2 Height Weight  06/19/21 0345 128/78 98.1 F (36.7 C) Oral 96 18 98 % 5\' 5"  (1.651 m) 64.8 kg   Constitutional: Well-developed, well-nourished female in no acute distress.  Cardiovascular: normal rate Respiratory: normal effort GI: Abd soft, non-tender, gravid appropriate for gestational age.  MS: Extremities nontender, no edema, normal ROM Neurologic: Alert  and oriented x 4.  GU: Neg CVAT.  PELVIC EXAM: Cervix pink, visually closed, without lesion, large amount thick white clumpy discharge, vaginal walls and external genitalia normal   Dilation: 6 Effacement (%): 70 Station: -2 Presentation: Vertex Exam by:: Misty Stanley leftwich-kirby, CNM  FHT:  Baseline 135 , moderate variability, accelerations present, no decelerations Contractions: q 1-1.5 mins   Labs: No results found for this or any previous visit (from the past 24 hour(s)). --/--/A NEG (05/12 2237)  Imaging:   MAU Course/MDM: Orders Placed This Encounter  Procedures   Resp Panel by RT-PCR (Flu A&B, Covid) Nasopharyngeal Swab   Group B strep by PCR   CBC   RPR   Diet clear liquid Room service appropriate? Yes; Fluid consistency: Thin   Vitals signs per unit policy   Notify physician (specify)   Fetal monitoring per unit policy   Activity as tolerated   Cervical Exam   Measure blood pressure post delivery every 15 min x 1 hour then every 30 min x 1 hour   Fundal check post delivery every 15 min x 1 hour then every 30 min x 1 hour   If Rapid HIV test positive or known HIV positive: initiate AZT orders   May in and out cath x 2 for inability to void   Insert urethral catheter X 1 PRN If Coude Catheter is chosen, qualified resources by campus can be found in the clinical skills nursing procedure for Coude Catheter 1. If straight catheterized > 2 times or patient unable to void post epidural plac...   Refer to Sidebar Report Urinary (Foley) Catheter Indications   Refer to Sidebar Report Post Indwelling Urinary Catheter Removal and Intervention Guidelines   Discontinue foley prior to vaginal delivery   Initiate Carrier Fluid Protocol   Initiate Oral Care Protocol   Patient may have epidural placement upon request   Full code   Type and screen [facility]   Insert and maintain IV Line   Admit to Inpatient (patient's expected length of stay will be greater than 2  midnights or inpatient only procedure)    Meds ordered this encounter  Medications   lactated ringers bolus 1,000 mL   fentaNYL (SUBLIMAZE) injection 100 mcg   lactated ringers infusion   lactated ringers infusion   oxytocin (PITOCIN) IV BOLUS FROM BAG   oxytocin (PITOCIN) IV infusion 30 units in NS 500 mL - Premix   lactated ringers infusion 500-1,000 mL   ondansetron (ZOFRAN) injection 4 mg   sodium citrate-citric acid (ORACIT) solution 30 mL   lidocaine (PF) (XYLOCAINE) 1 % injection  30 mL   FOLLOWED BY Linked Order Group    penicillin G potassium 5 Million Units in sodium chloride 0.9 % 250 mL IVPB     Order Specific Question:   Antibiotic Indication:     Answer:   Group B Strep Prophylaxis    penicillin G potassium 3 Million Units in dextrose 6mL IVPB     Order Specific Question:   Antibiotic Indication:     Answer:   Group B Strep Prophylaxis   fentaNYL (SUBLIMAZE) injection 100 mcg     NST reviewed and reactive Bag of water felt on exam, ferning slide negative, no evidence of ROM Cervix 5 cm, 2 prior vaginal deliveries Start IV fluids, plan to recheck in 1 hour Cervix remains 5 cm in 1 hour but with BBOW with contraction Recheck in 1 hour Cervix changed to 6/70/-2, BBOW with contraction Called Dr Royston Sinner to recommend admission Admit to L&D GBS swab collected PCN started in MAU Fentanyl IV Q 1 hour ordered, pt held in MAU until room available on L&D    Assessment: 1. Preterm labor in third trimester without delivery   2. [redacted] weeks gestation of pregnancy     Plan: Admit to L&D    Fatima Blank Certified Nurse-Midwife 06/19/2021 7:07 AM

## 2021-06-19 NOTE — H&P (Signed)
OB History and Physical   Jacqueline Meadows is a 20 y.o. female [id number] presenting for contractions at [redacted]w[redacted]d, admitted for labor at 6/70/-2.  Pregnancy history is notable for IUGR in G1 pregnancy, third trimester growth during current pregnancy WNL, hypothyroidism on synthroid.  Rh negative s/p Rhogam 04/28/2021, GBS unknown.   She has had contractions and pelvic pain ongoing throughout third trimester.  Pain increased overnight and she also presented for concern for LOF.  Membranes intact but made cervical change in MAU to 6 cm    OB History     Gravida  3   Para  2   Term  2   Preterm  0   AB  0   Living  2      SAB  0   IAB  0   Ectopic  0   Multiple  0   Live Births  2          Past Medical History:  Diagnosis Date   Anxiety    Hyperthyroidism    Migraine    Premature baby    7 weeks early   Past Surgical History:  Procedure Laterality Date   FOOT SURGERY Bilateral 03-2015   Rods in both feet   Family History: family history includes ADD / ADHD in her brother and mother; Anxiety disorder in her brother and mother; Bipolar disorder in her mother and paternal aunt; Cancer in her maternal grandmother, paternal aunt, and paternal uncle; Depression in her brother and mother; Hearing loss in her brother and mother; Migraines in her mother; Other in her paternal aunt; Ovarian cancer in her maternal aunt; Suicidality in her paternal aunt. Social History:  reports that she has never smoked. She has never used smokeless tobacco. She reports that she does not drink alcohol and does not use drugs.     Maternal Diabetes: No Genetic Screening: Normal Maternal Ultrasounds/Referrals: Normal Fetal Ultrasounds or other Referrals:  None Maternal Substance Abuse:  No Significant Maternal Medications:  synthroid Significant Maternal Lab Results:  Other:  GBS unknown Other Comments:  None  Review of Systems denies fever, chills, SOB, CP.  History Dilation:  6 Effacement (%): 70 Station: -2 Exam by:: Lisa leftwich-kirby, CNM Blood pressure 132/76, pulse 87, temperature 98.1 F (36.7 C), temperature source Oral, resp. rate 17, height 5\' 5"  (1.651 m), weight 64.8 kg, last menstrual period 10/03/2020, SpO2 98 %, unknown if currently breastfeeding. Exam Physical Exam  Gen: alert, well appearing, no distress Chest: nonlabored breathing CV: no peripheral edema Abdomen: soft, nontender, gravid, Ext: no evidence of DVT  FHT cat 1, ctx present q 2-3 min   Prenatal labs: ABO, Rh: --/--/PENDING (12/16 06-14-1996) Antibody: PENDING (12/16 0622) Rubella: Nonimmune (06/02 0000) RPR: Nonreactive (06/02 0000)  HBsAg: Negative (06/02 0000)  HIV: Non-reactive (06/02 0000)  GBS:     Assessment/Plan: Admit to Labor and Delivery PCN IV for GBS unknown and preterm status Epidural when desired Anticipate vaginal delivery    06-25-1983 06/19/2021, 8:17 AM

## 2021-06-19 NOTE — Lactation Note (Signed)
This note was copied from a baby's chart. Lactation Consultation Note Baby sleeping. Mom stated she has BF well and baby has been supplemented. Mom BF her almost 22 yr old for 3 months and her 22 yr old for 3 months. Mom demonstrated easily expressed colostrum. Praised mom. LC set up DEBP. Mom shown how to use DEBP & how to disassemble, clean, & reassemble parts. Mom knows to pump q3h for 15-20 min. Flanges fitted.  LC reviewed LPI information sheet and importance of supplementing and pumping. Mom states understanding. Strongly encouraged STS and strict I&O. This LC will try to go for next feeding if able. Lactation brochure given.  Patient Name: Jacqueline Meadows CHYIF'O Date: 06/19/2021 Reason for consult: Initial assessment;Late-preterm 34-36.6wks;Infant < 6lbs;Maternal endocrine disorder Age:2 hours  Maternal Data Has patient been taught Hand Expression?: Yes Does the patient have breastfeeding experience prior to this delivery?: Yes How long did the patient breastfeed?: 3 months each for her other 2 children  Feeding Nipple Type: Dr. Levert Feinstein Preemie  LATCH Score                    Lactation Tools Discussed/Used Tools: Pump Breast pump type: Double-Electric Breast Pump Pump Education: Setup, frequency, and cleaning;Milk Storage Reason for Pumping: LPI Pumping frequency: Q3 hr  Interventions    Discharge    Consult Status Consult Status: Follow-up Date: 06/20/21 Follow-up type: In-patient    Sayana Salley, Diamond Nickel 06/19/2021, 9:00 PM

## 2021-06-19 NOTE — Lactation Note (Signed)
This note was copied from a baby's chart. Lactation Consultation Note Mom trying to wake baby for feeding. Baby very sleepy. LC attempted to stimulate w/gloved finger to get baby suckling while mom applies her personal NS. Baby does suckle on finger. Place to breast and baby latches well. A few reminders of BF newborn given. Baby suckling well. Praise mom. Mom will supplement after BF.  Patient Name: Jacqueline Meadows [id number]'O Date: 06/19/2021 Reason for consult: Follow-up assessment;Late-preterm 34-36.6wks;Infant < 6lbs Age:61 hours  Maternal Data Has patient been taught Hand Expression?: Yes Does the patient have breastfeeding experience prior to this delivery?: Yes How long did the patient breastfeed?: 3 months each for her other 2 children  Feeding Nipple Type: Dr. Levert Feinstein Preemie  LATCH Score Latch: Grasps breast easily, tongue down, lips flanged, rhythmical sucking.  Audible Swallowing: A few with stimulation  Type of Nipple: Everted at rest and after stimulation  Comfort (Breast/Nipple): Soft / non-tender  Hold (Positioning): Assistance needed to correctly position infant at breast and maintain latch.  LATCH Score: 8   Lactation Tools Discussed/Used Tools: Nipple Dorris Carnes;Pump (mom has her own NS #20) Nipple shield size: 20 Breast pump type: Double-Electric Breast Pump Pump Education: Setup, frequency, and cleaning;Milk Storage Reason for Pumping: LPI Pumping frequency: Q3 hr  Interventions    Discharge    Consult Status Consult Status: Follow-up Date: 06/20/21 Follow-up type: In-patient    Charyl Dancer 06/19/2021, 10:13 PM

## 2021-06-19 NOTE — Plan of Care (Signed)
Problem: Education: Goal: Knowledge of condition will improve Outcome: Completed/Met   Problem: Activity: Goal: Will verbalize the importance of balancing activity with adequate rest periods Outcome: Completed/Met Goal: Ability to tolerate increased activity will improve Outcome: Completed/Met   Problem: Life Cycle: Goal: Chance of risk for complications during the postpartum period will decrease Outcome: Completed/Met   Problem: Role Relationship: Goal: Ability to demonstrate positive interaction with newborn will improve Outcome: Completed/Met   Problem: Skin Integrity: Goal: Demonstration of wound healing without infection will improve Outcome: Completed/Met   

## 2021-06-20 LAB — CBC
HCT: 28.3 % — ABNORMAL LOW (ref 36.0–46.0)
Hemoglobin: 9.1 g/dL — ABNORMAL LOW (ref 12.0–15.0)
MCH: 26.5 pg (ref 26.0–34.0)
MCHC: 32.2 g/dL (ref 30.0–36.0)
MCV: 82.5 fL (ref 80.0–100.0)
Platelets: 221 10*3/uL (ref 150–400)
RBC: 3.43 MIL/uL — ABNORMAL LOW (ref 3.87–5.11)
RDW: 13.7 % (ref 11.5–15.5)
WBC: 12.3 10*3/uL — ABNORMAL HIGH (ref 4.0–10.5)
nRBC: 0 % (ref 0.0–0.2)

## 2021-06-20 MED ORDER — DOCUSATE SODIUM 100 MG PO CAPS
100.0000 mg | ORAL_CAPSULE | Freq: Every day | ORAL | Status: DC
  Administered 2021-06-20 – 2021-06-21 (×2): 100 mg via ORAL
  Filled 2021-06-20 (×2): qty 1

## 2021-06-20 MED ORDER — RHO D IMMUNE GLOBULIN 1500 UNIT/2ML IJ SOSY
300.0000 ug | PREFILLED_SYRINGE | Freq: Once | INTRAMUSCULAR | Status: AC
  Administered 2021-06-20: 300 ug via INTRAVENOUS
  Filled 2021-06-20: qty 2

## 2021-06-20 MED ORDER — FERROUS SULFATE 325 (65 FE) MG PO TABS
325.0000 mg | ORAL_TABLET | ORAL | Status: DC
  Administered 2021-06-20: 325 mg via ORAL
  Filled 2021-06-20: qty 1

## 2021-06-20 NOTE — Progress Notes (Signed)
Postpartum Progress Note  Post Partum Day 1 s/p spontaneous vaginal delivery.  Patient reports well-controlled pain, ambulating without difficulty, voiding spontaneously, tolerating PO.  Vaginal bleeding is appropriate.   Objective: Blood pressure 116/65, pulse 69, temperature 97.7 F (36.5 C), temperature source Oral, resp. rate 18, height 5\' 5"  (1.651 m), weight 64.8 kg, last menstrual period 10/03/2020, SpO2 100 %, unknown if currently breastfeeding.  Physical Exam:  General: alert and no distress Lochia: appropriate Uterine Fundus: firm DVT Evaluation: No evidence of DVT seen on physical exam.  Recent Labs    06/19/21 0622 06/20/21 0513  HGB 10.6* 9.1*  HCT 33.5* 28.3*    Assessment/Plan: Postpartum Day 1, s/p vaginal delivery. Continue routine postpartum care Acute blood loss anemia - Fe and colace Lactation following Anticipate discharge home tomorrow, baby is planning to stay (had a low blood glucose, 36 week delivery).  Will await clearance from nursery for circ.    LOS: 1 day   06/22/21 06/20/2021, 8:03 AM

## 2021-06-20 NOTE — Lactation Note (Signed)
This note was copied from a baby's chart. Lactation Consultation Note When LC entered room mom was so excited to tell me that she is latching the baby w/o nipple guard. Mom denies painful latch. Praised mom. Mom feels transfer from breast. Mom is pumping and giving baby colostrum as well as formula. Reviewed LPI information sheet on supplementing encouraging mom to increase supplementation amount. Mom states understanding. Praised mom for all her hard work. Encouraged to call for assistance or questions.  Patient Name: Jacqueline Meadows [id number] Date: 06/20/2021 Reason for consult: Follow-up assessment;Infant < 6lbs;Late-preterm 34-36.6wks Age:22 hours  Maternal Data    Feeding Mother's Current Feeding Choice: Breast Milk and Formula  LATCH Score                    Lactation Tools Discussed/Used Tools: Pump;Flanges Flange Size: 27 Breast pump type: Double-Electric Breast Pump Reason for Pumping: LPI  Interventions    Discharge    Consult Status Consult Status: Follow-up Date: 06/21/21 Follow-up type: In-patient    Charyl Dancer 06/20/2021, 7:20 PM

## 2021-06-21 LAB — RH IG WORKUP (INCLUDES ABO/RH)
Fetal Screen: NEGATIVE
Gestational Age(Wks): 36
Unit division: 0

## 2021-06-21 MED ORDER — ACETAMINOPHEN 325 MG PO TABS: 650.0000 mg | ORAL_TABLET | ORAL | 0 refills | Status: DC | PRN

## 2021-06-21 MED ORDER — IBUPROFEN 600 MG PO TABS: 600.0000 mg | ORAL_TABLET | Freq: Four times a day (QID) | ORAL | 0 refills | Status: DC

## 2021-06-21 NOTE — Progress Notes (Signed)
Per MOM As a child she had the MMR vaccination and had a serious reaction.  MOM has declined the MMR.

## 2021-06-21 NOTE — Progress Notes (Signed)
Postpartum Progress Note  Post Partum Day 2 s/p spontaneous vaginal delivery.  Patient reports well-controlled pain, ambulating without difficulty, voiding spontaneously, tolerating PO.  Vaginal bleeding is appropriate.   Objective: Blood pressure 113/83, pulse 72, temperature 98.2 F (36.8 C), temperature source Oral, resp. rate 18, height 5\' 5"  (1.651 m), weight 64.8 kg, last menstrual period 10/03/2020, SpO2 100 %, unknown if currently breastfeeding.  Physical Exam:  General: alert and no distress Lochia: appropriate Uterine Fundus: firm DVT Evaluation: No evidence of DVT seen on physical exam.  Recent Labs    06/19/21 0622 06/20/21 0513  HGB 10.6* 9.1*  HCT 33.5* 28.3*     Assessment/Plan: Postpartum Day 2, s/p vaginal delivery. Continue routine postpartum care Acute blood loss anemia - Fe and colace Lactation following Desires circ, will do prior to discharge Plan discharge later today.    LOS: 2 days   06/22/21 06/21/2021, 7:20 AM

## 2021-06-21 NOTE — Lactation Note (Signed)
This note was copied from a baby's chart. Lactation Consultation Note  Patient Name: Jacqueline Meadows [id number] Date: 06/21/2021 Reason for consult: Follow-up assessment;Infant < 6lbs;Late-preterm 34-36.6wks Age:22 hours   Follow Up Lactation Consult:  P3 mother whose infant is now 11 hours old.  This is a LPTI at 36+0 weeks with a CGA of 36+2 weeks weighing < 6 lbs.  Mother breast fed her other two children (now 88 and 27 years old).  Mother's current feeding preference is breast/formula until she has a full milk supply.  RN in room when I arrived updating chart.  Praised mother and father for their efforts with caring for and feeding this baby.  Mother has been breast feeding and has recently been able to latch without a NS.  She is supplementing with her EBM (when possible) and  using formula as needed.  This morning she pumped 20 mls of EBM and was currently feeding baby when I arrived.  Baby has been feeding well.  Multiple voids/stools.  Reviewed feeding plan for after discharge to include breast feeding followed by supplementation with EBM and/or formula and pumping for 15 minutes.  Mother has been diligent with recording baby's events/feedings in the hospital and dedicated to feeding/pumping.  This is very encouraging.  Follow up pediatrician's visit will be on Tuesday.  Parents have our OP phone number for any further questions/concerns.    Mother has a Medela DEBP for home use.  Maternal Data    Feeding Mother's Current Feeding Choice: Breast Milk and Formula  LATCH Score                    Lactation Tools Discussed/Used    Interventions Interventions: Education;Breast feeding basics reviewed  Discharge Discharge Education: Engorgement and breast care Pump: DEBP;Manual;Personal (Medela) WIC Program: Yes  Consult Status Consult Status: Complete Date: 06/21/21 Follow-up type: Call as needed    Lyliana Dicenso R Taichi Repka 06/21/2021, 9:32 AM

## 2021-06-22 LAB — GC/CHLAMYDIA PROBE AMP (~~LOC~~) NOT AT ARMC
Chlamydia: NEGATIVE
Comment: NEGATIVE
Comment: NORMAL
Neisseria Gonorrhea: NEGATIVE

## 2021-06-24 NOTE — Discharge Summary (Signed)
Obstetric Discharge Summary  Jacqueline Meadows is a 20 y.o. female that presented on 06/19/2021 for 06/19/21.  She was admitted to labor and delivery for contractions at 36 weeks and labor.  Her labor course was uncomplicated and she delivered a viable female infant on 06/19/21.  Her postpartum course was uncomplicated and on PPD#2, she reported well controlled pain, spontaneous voiding, ambulating without difficulty, and tolerating PO.  She was stable for discharge home on 06/21/21 with plans for in-office follow up.  Hemoglobin  Date Value Ref Range Status  06/20/2021 9.1 (L) 12.0 - 15.0 g/dL Final  23/34/3568 61.6  Final   HCT  Date Value Ref Range Status  06/20/2021 28.3 (L) 36.0 - 46.0 % Final  12/15/2017 42 (A) 29 - 41 Final    Physical Exam:  General: alert and no distress Lochia: appropriate Uterine Fundus: firm DVT Evaluation: No evidence of DVT seen on physical exam.  Discharge Diagnoses: delivered, 36 weeks  Discharge Information: Date: 06/24/2021 Activity: Pelvic rest, as tolerated Diet: routine Medications: Tylenol, motrin Condition: stable Instructions: Refer to practice specific booklet.  Discussed prior to discharge.  Discharge to: Home  Follow-up Information     [facility] Follow up.   Why: Please follow up for 6 week postpartum visit. Contact information: [address] [place] [address] [phone]                 Newborn Data: Live born female  Birth Weight: 5 lb 13.5 oz (2650 g) APGAR: 9, 9  Newborn Delivery   Birth date/time: 06/19/2021 16:30:00 Delivery type: Vaginal, Spontaneous      Home with mother.  Lyn Henri 06/24/2021, 8:08 AM

## 2021-07-02 ENCOUNTER — Telehealth (HOSPITAL_COMMUNITY): Payer: Self-pay | Admitting: *Deleted

## 2021-07-02 NOTE — Telephone Encounter (Signed)
Mom reports feeling good. No concerns about herself at this time. EPDS=0(No Hospital score) Mom reports baby is doing well. Feeding, peeing, and pooping without difficulty. Safe sleep reviewed. Mom reports no concerns about baby at present.  Duffy Rhody, RN 07-02-2021 at 3:10pm

## 2021-08-10 ENCOUNTER — Other Ambulatory Visit: Payer: Self-pay

## 2021-08-10 ENCOUNTER — Encounter (HOSPITAL_BASED_OUTPATIENT_CLINIC_OR_DEPARTMENT_OTHER): Payer: Self-pay | Admitting: Obstetrics & Gynecology

## 2021-08-10 NOTE — Progress Notes (Signed)
Spoke w/ via phone for pre-op interview--- [facility] Lab needs dos----    UPT, T&S and CBC.           Lab results------ COVID test -----patient states asymptomatic no test needed Arrive at -------0530 NPO after MN NO Solid Food.   Med rec completed Medications to take morning of surgery -----NONE Diabetic medication ----- Patient instructed no nail polish to be worn day of surgery Patient instructed to bring photo id and insurance card day of surgery Patient aware to have Driver (ride ) / caregiver Mother or father Thayer Ohm or Victorino Dike Graig    for 24 hours after surgery  Patient Special Instructions ----- Pre-Op special Istructions ----- Patient verbalized understanding of instructions that were given at this phone interview. Patient denies shortness of breath, chest pain, fever, cough at this phone interview.

## 2021-08-11 NOTE — H&P (Signed)
Jacqueline Meadows is an 21 y.o. female G3P3 with desire for sterility.  Patient has no prior surgical history.    Pertinent Gynecological History: Menses:  lactational amenorrhea Bleeding: n/a Contraception: abstinence DES exposure: unknown Blood transfusions: none Sexually transmitted diseases: no past history Previous GYN Procedures:  n/a   Last mammogram:  n/a  Date: n/a Last pap: normal Date: 07/28/2021 OB History: G3, P3   Menstrual History: Menarche age: n/a Patient's last menstrual period was 10/03/2020 (approximate).    Past Medical History:  Diagnosis Date   Anxiety    Hyperthyroidism    Migraine    Premature baby    7 weeks early    Past Surgical History:  Procedure Laterality Date   FOOT SURGERY Bilateral 03-2015   Rods in both feet    Family History  Problem Relation Age of Onset   Anxiety disorder Mother    Depression Mother    ADD / ADHD Mother    Bipolar disorder Mother    Migraines Mother    Hearing loss Mother    Anxiety disorder Brother    Depression Brother    Hearing loss Brother    Bipolar disorder Paternal Aunt        3 paternal aunts bipolar dx and on disability   Suicidality Paternal Aunt        paternal aunt attempted suicide 2 times   Other Paternal Aunt        Hx of substance abuse   Cancer Paternal Aunt    ADD / ADHD Brother    Cancer Maternal Grandmother        ovarian uterine colon   Ovarian cancer Maternal Aunt    Cancer Paternal Uncle     Social History:  reports that she has never smoked. She has never used smokeless tobacco. She reports that she does not drink alcohol and does not use drugs.  Allergies:  Allergies  Allergen Reactions   Vicodin [Hydrocodone-Acetaminophen] Rash    Pt states she can take Oxycodone    No medications prior to admission.    Review of Systems  Height 5\' 5"  (1.651 m), weight 56.2 kg, last menstrual period 10/03/2020, not currently breastfeeding. Physical Exam Constitutional:       Appearance: Normal appearance.  HENT:     Head: Normocephalic and atraumatic.  Pulmonary:     Effort: Pulmonary effort is normal.  Abdominal:     Palpations: Abdomen is soft.  Musculoskeletal:        General: Normal range of motion.     Cervical back: Normal range of motion.  Skin:    General: Skin is warm and dry.  Neurological:     Mental Status: She is alert and oriented to person, place, and time.  Psychiatric:        Mood and Affect: Mood normal.        Behavior: Behavior normal.    No results found for this or any previous visit (from the past 24 hour(s)).  No results found.  Assessment/Plan: 21yo G3P3 with desire for sterility -L/S BTL -Patient is counseled re: risk of bleeding, infection, scarring, and damage to surrounding structures.  She is informed of the risk of failure with 50% risk of ectopic and regret.  She is counseled re: steps of the procedure, postop expectations and limitations and need for 6 weeks of contraception after surgery.  All questions were answered and patient wishes to proceed.  21yo 08/11/2021, 3:09 PM

## 2021-08-13 NOTE — Anesthesia Preprocedure Evaluation (Addendum)
Anesthesia Evaluation  Patient identified by MRN, date of birth, ID band Patient awake    Reviewed: Allergy & Precautions, NPO status , Patient's Chart, lab work & pertinent test results  History of Anesthesia Complications Negative for: history of anesthetic complications  Airway Mallampati: II  TM Distance: >3 FB Neck ROM: Full    Dental  (+) Dental Advisory Given, Teeth Intact   Pulmonary neg pulmonary ROS,    Pulmonary exam normal        Cardiovascular negative cardio ROS Normal cardiovascular exam     Neuro/Psych  Headaches, PSYCHIATRIC DISORDERS Anxiety    GI/Hepatic negative GI ROS, Neg liver ROS,   Endo/Other  Hyperthyroidism   Renal/GU negative Renal ROS     Musculoskeletal negative musculoskeletal ROS (+)   Abdominal   Peds  Hematology negative hematology ROS (+)   Anesthesia Other Findings   Reproductive/Obstetrics                            Anesthesia Physical Anesthesia Plan  ASA: 2  Anesthesia Plan: General   Post-op Pain Management: Tylenol PO (pre-op) and Celebrex PO (pre-op)   Induction: Intravenous  PONV Risk Score and Plan: 4 or greater and Treatment may vary due to age or medical condition, Ondansetron, Dexamethasone, Midazolam and Scopolamine patch - Pre-op  Airway Management Planned: Oral ETT  Additional Equipment: None  Intra-op Plan:   Post-operative Plan: Extubation in OR  Informed Consent: I have reviewed the patients History and Physical, chart, labs and discussed the procedure including the risks, benefits and alternatives for the proposed anesthesia with the patient or authorized representative who has indicated his/her understanding and acceptance.     Dental advisory given  Plan Discussed with: CRNA and Anesthesiologist  Anesthesia Plan Comments:        Anesthesia Quick Evaluation

## 2021-08-14 ENCOUNTER — Other Ambulatory Visit: Payer: Self-pay

## 2021-08-14 ENCOUNTER — Ambulatory Visit (HOSPITAL_BASED_OUTPATIENT_CLINIC_OR_DEPARTMENT_OTHER): Payer: Managed Care, Other (non HMO) | Admitting: Anesthesiology

## 2021-08-14 ENCOUNTER — Encounter (HOSPITAL_BASED_OUTPATIENT_CLINIC_OR_DEPARTMENT_OTHER): Payer: Self-pay | Admitting: Obstetrics & Gynecology

## 2021-08-14 ENCOUNTER — Encounter (HOSPITAL_BASED_OUTPATIENT_CLINIC_OR_DEPARTMENT_OTHER)
Admission: RE | Disposition: A | Discharge status: Home / Self Care | Payer: Self-pay | Source: Home / Self Care | Attending: Obstetrics & Gynecology

## 2021-08-14 ENCOUNTER — Ambulatory Visit (HOSPITAL_BASED_OUTPATIENT_CLINIC_OR_DEPARTMENT_OTHER)
Admission: RE | Admit: 2021-08-14 | Discharge: 2021-08-14 | Disposition: A | Discharge status: Home / Self Care | Payer: Managed Care, Other (non HMO) | Attending: Obstetrics & Gynecology | Admitting: Obstetrics & Gynecology

## 2021-08-14 DIAGNOSIS — Z302 Encounter for sterilization: Secondary | ICD-10-CM

## 2021-08-14 DIAGNOSIS — E0590 Thyrotoxicosis, unspecified without thyrotoxic crisis or storm: Secondary | ICD-10-CM | POA: Insufficient documentation

## 2021-08-14 DIAGNOSIS — F419 Anxiety disorder, unspecified: Secondary | ICD-10-CM | POA: Diagnosis not present

## 2021-08-14 DIAGNOSIS — N979 Female infertility, unspecified: Secondary | ICD-10-CM

## 2021-08-14 HISTORY — PX: LAPAROSCOPIC TUBAL LIGATION: SHX1937

## 2021-08-14 LAB — CBC
HCT: 35 % — ABNORMAL LOW (ref 36.0–46.0)
Hemoglobin: 11.2 g/dL — ABNORMAL LOW (ref 12.0–15.0)
MCH: 26.9 pg (ref 26.0–34.0)
MCHC: 32 g/dL (ref 30.0–36.0)
MCV: 84.1 fL (ref 80.0–100.0)
Platelets: 267 10*3/uL (ref 150–400)
RBC: 4.16 MIL/uL (ref 3.87–5.11)
RDW: 15.9 % — ABNORMAL HIGH (ref 11.5–15.5)
WBC: 7.2 10*3/uL (ref 4.0–10.5)
nRBC: 0 % (ref 0.0–0.2)

## 2021-08-14 LAB — TYPE AND SCREEN
ABO/RH(D): A NEG
Antibody Screen: POSITIVE

## 2021-08-14 LAB — POCT PREGNANCY, URINE: Preg Test, Ur: NEGATIVE

## 2021-08-14 SURGERY — LIGATION, FALLOPIAN TUBE, LAPAROSCOPIC
Anesthesia: General | Site: Abdomen | Laterality: Bilateral

## 2021-08-14 MED ORDER — OXYCODONE HCL 5 MG PO TABA: 5.0000 mg | ORAL_TABLET | Freq: Four times a day (QID) | ORAL | 0 refills | Status: DC | PRN

## 2021-08-14 MED ORDER — HYDROMORPHONE HCL 1 MG/ML IJ SOLN
0.2500 mg | INTRAMUSCULAR | Status: DC | PRN
  Administered 2021-08-14 (×2): 0.5 mg via INTRAVENOUS

## 2021-08-14 MED ORDER — DEXAMETHASONE SODIUM PHOSPHATE 4 MG/ML IJ SOLN
INTRAMUSCULAR | Status: DC | PRN
  Administered 2021-08-14: 8 mg via INTRAVENOUS

## 2021-08-14 MED ORDER — MIDAZOLAM HCL 2 MG/2ML IJ SOLN
INTRAMUSCULAR | Status: DC | PRN
  Administered 2021-08-14: 2 mg via INTRAVENOUS

## 2021-08-14 MED ORDER — LACTATED RINGERS IV SOLN: INTRAVENOUS | Status: DC

## 2021-08-14 MED ORDER — CELECOXIB 200 MG PO CAPS
200.0000 mg | ORAL_CAPSULE | Freq: Once | ORAL | Status: AC
  Administered 2021-08-14: 200 mg via ORAL

## 2021-08-14 MED ORDER — CEFAZOLIN SODIUM-DEXTROSE 2-3 GM-%(50ML) IV SOLR
INTRAVENOUS | Status: DC | PRN
  Administered 2021-08-14: 2 g via INTRAVENOUS

## 2021-08-14 MED ORDER — FENTANYL CITRATE (PF) 100 MCG/2ML IJ SOLN
INTRAMUSCULAR | Status: AC
  Filled 2021-08-14: qty 2

## 2021-08-14 MED ORDER — ACETAMINOPHEN 500 MG PO TABS
1000.0000 mg | ORAL_TABLET | Freq: Once | ORAL | Status: AC
  Administered 2021-08-14: 1000 mg via ORAL

## 2021-08-14 MED ORDER — PROMETHAZINE HCL 25 MG/ML IJ SOLN: 6.2500 mg | INTRAMUSCULAR | Status: DC | PRN

## 2021-08-14 MED ORDER — KETOROLAC TROMETHAMINE 30 MG/ML IJ SOLN
INTRAMUSCULAR | Status: DC | PRN
  Administered 2021-08-14: 15 mg via INTRAVENOUS

## 2021-08-14 MED ORDER — ONDANSETRON HCL 4 MG/2ML IJ SOLN
INTRAMUSCULAR | Status: DC | PRN
  Administered 2021-08-14: 4 mg via INTRAVENOUS

## 2021-08-14 MED ORDER — ROCURONIUM BROMIDE 100 MG/10ML IV SOLN
INTRAVENOUS | Status: DC | PRN
Start: 2021-08-14 — End: 2021-08-14
  Administered 2021-08-14: 50 mg via INTRAVENOUS

## 2021-08-14 MED ORDER — OXYCODONE HCL 5 MG/5ML PO SOLN: 5.0000 mg | Freq: Once | ORAL | Status: AC | PRN

## 2021-08-14 MED ORDER — MIDAZOLAM HCL 2 MG/2ML IJ SOLN
INTRAMUSCULAR | Status: AC
  Filled 2021-08-14: qty 2

## 2021-08-14 MED ORDER — CELECOXIB 200 MG PO CAPS
ORAL_CAPSULE | ORAL | Status: AC
  Filled 2021-08-14: qty 1

## 2021-08-14 MED ORDER — ACETAMINOPHEN 500 MG PO TABS
ORAL_TABLET | ORAL | Status: AC
  Filled 2021-08-14: qty 2

## 2021-08-14 MED ORDER — 0.9 % SODIUM CHLORIDE (POUR BTL) OPTIME
TOPICAL | Status: DC | PRN
Start: 2021-08-14 — End: 2021-08-14
  Administered 2021-08-14: 500 mL

## 2021-08-14 MED ORDER — OXYCODONE HCL 5 MG PO TABS
5.0000 mg | ORAL_TABLET | Freq: Once | ORAL | Status: AC | PRN
  Administered 2021-08-14: 5 mg via ORAL

## 2021-08-14 MED ORDER — OXYCODONE HCL 5 MG PO TABS
ORAL_TABLET | ORAL | Status: AC
  Filled 2021-08-14: qty 1

## 2021-08-14 MED ORDER — BUPIVACAINE HCL (PF) 0.25 % IJ SOLN
INTRAMUSCULAR | Status: DC | PRN
  Administered 2021-08-14: 3 mL

## 2021-08-14 MED ORDER — HYDROMORPHONE HCL 1 MG/ML IJ SOLN
INTRAMUSCULAR | Status: AC
  Filled 2021-08-14: qty 1

## 2021-08-14 MED ORDER — CEFAZOLIN SODIUM 1 G IJ SOLR
INTRAMUSCULAR | Status: AC
  Filled 2021-08-14: qty 20

## 2021-08-14 MED ORDER — PROPOFOL 10 MG/ML IV BOLUS
INTRAVENOUS | Status: DC | PRN
  Administered 2021-08-14: 170 mg via INTRAVENOUS
  Administered 2021-08-14: 40 mg via INTRAVENOUS
  Administered 2021-08-14: 30 mg via INTRAVENOUS

## 2021-08-14 MED ORDER — POVIDONE-IODINE 10 % EX SWAB: 2.0000 "application " | Freq: Once | CUTANEOUS | Status: DC

## 2021-08-14 MED ORDER — PROPOFOL 10 MG/ML IV BOLUS
INTRAVENOUS | Status: AC
  Filled 2021-08-14: qty 20

## 2021-08-14 MED ORDER — FENTANYL CITRATE (PF) 100 MCG/2ML IJ SOLN
INTRAMUSCULAR | Status: DC | PRN
  Administered 2021-08-14 (×4): 50 ug via INTRAVENOUS

## 2021-08-14 MED ORDER — FENTANYL CITRATE (PF) 100 MCG/2ML IJ SOLN
25.0000 ug | INTRAMUSCULAR | Status: DC | PRN
  Administered 2021-08-14 (×2): 50 ug via INTRAVENOUS

## 2021-08-14 MED ORDER — SUGAMMADEX SODIUM 200 MG/2ML IV SOLN
INTRAVENOUS | Status: DC | PRN
  Administered 2021-08-14: 220 mg via INTRAVENOUS

## 2021-08-14 SURGICAL SUPPLY — 40 items
ADH SKN CLS APL DERMABOND .7 (GAUZE/BANDAGES/DRESSINGS) ×1
APL SKNCLS STERI-STRIP NONHPOA (GAUZE/BANDAGES/DRESSINGS)
BENZOIN TINCTURE PRP APPL 2/3 (GAUZE/BANDAGES/DRESSINGS) IMPLANT
CATH ROBINSON RED A/P 16FR (CATHETERS) ×3 IMPLANT
CLIP FILSHIE TUBAL LIGA STRL (Clip) ×1 IMPLANT
COVER MAYO STAND STRL (DRAPES) ×3 IMPLANT
DERMABOND ADVANCED (GAUZE/BANDAGES/DRESSINGS) ×1
DERMABOND ADVANCED .7 DNX12 (GAUZE/BANDAGES/DRESSINGS) IMPLANT
DRSG COVADERM PLUS 2X2 (GAUZE/BANDAGES/DRESSINGS) IMPLANT
DRSG OPSITE POSTOP 3X4 (GAUZE/BANDAGES/DRESSINGS) IMPLANT
DRSG TEGADERM 4X4.75 (GAUZE/BANDAGES/DRESSINGS) IMPLANT
DURAPREP 26ML APPLICATOR (WOUND CARE) ×3 IMPLANT
GAUZE 4X4 16PLY ~~LOC~~+RFID DBL (SPONGE) ×6 IMPLANT
GLOVE SURG POLYISO LF SZ5.5 (GLOVE) ×3 IMPLANT
GLOVE SURG UNDER POLY LF SZ6 (GLOVE) ×6 IMPLANT
GLOVE SURG UNDER POLY LF SZ6.5 (GLOVE) ×1 IMPLANT
GLOVE SURG UNDER POLY LF SZ7.5 (GLOVE) ×1 IMPLANT
GOWN STRL REUS W/TWL LRG LVL3 (GOWN DISPOSABLE) ×9 IMPLANT
KIT TURNOVER CYSTO (KITS) ×3 IMPLANT
NEEDLE INSUFFLATION 120MM (ENDOMECHANICALS) ×3 IMPLANT
NS IRRIG 500ML POUR BTL (IV SOLUTION) ×3 IMPLANT
PACK LAPAROSCOPY BASIN (CUSTOM PROCEDURE TRAY) ×3 IMPLANT
PACK TRENDGUARD 450 HYBRID PRO (MISCELLANEOUS) IMPLANT
PAD OB MATERNITY 4.3X12.25 (PERSONAL CARE ITEMS) ×3 IMPLANT
PENCIL SMOKE EVACUATOR (MISCELLANEOUS) IMPLANT
SCISSORS LAP 5X35 DISP (ENDOMECHANICALS) IMPLANT
SET SUCTION IRRIG HYDROSURG (IRRIGATION / IRRIGATOR) IMPLANT
SET TUBE SMOKE EVAC HIGH FLOW (TUBING) ×3 IMPLANT
SPONGE GAUZE 2X2 8PLY STRL LF (GAUZE/BANDAGES/DRESSINGS) IMPLANT
STRIP CLOSURE SKIN 1/2X4 (GAUZE/BANDAGES/DRESSINGS) IMPLANT
STRIP CLOSURE SKIN 1/4X4 (GAUZE/BANDAGES/DRESSINGS) IMPLANT
SUT MNCRL AB 3-0 PS2 18 (SUTURE) ×3 IMPLANT
SUT VICRYL 0 UR6 27IN ABS (SUTURE) ×3 IMPLANT
TOWEL OR 17X26 10 PK STRL BLUE (TOWEL DISPOSABLE) ×3 IMPLANT
TRENDGUARD 450 HYBRID PRO PACK (MISCELLANEOUS)
TROCAR BLADELESS OPT 5 100 (ENDOMECHANICALS) IMPLANT
TROCAR XCEL NON-BLD 11X100MML (ENDOMECHANICALS) ×3 IMPLANT
TUBE CONNECTING 12X1/4 (SUCTIONS) IMPLANT
WARMER LAPAROSCOPE (MISCELLANEOUS) ×3 IMPLANT
WATER STERILE IRR 500ML POUR (IV SOLUTION) ×3 IMPLANT

## 2021-08-14 NOTE — Transfer of Care (Signed)
Immediate Anesthesia Transfer of Care Note  Patient: Jacqueline Meadows  Procedure(s) Performed: LAPAROSCOPIC TUBAL LIGATION WITH FILSHIE CLIPS (Bilateral: Abdomen)  Patient Location: PACU  Anesthesia Type:General  Level of Consciousness: awake, alert , oriented and patient cooperative  Airway & Oxygen Therapy: Patient Spontanous Breathing and Patient connected to nasal cannula oxygen  Post-op Assessment: Report given to RN and Post -op Vital signs reviewed and stable  Post vital signs: Reviewed and stable  Last Vitals:  Vitals Value Taken Time  BP 132/84 08/14/21 0836  Temp    Pulse 83 08/14/21 0842  Resp 17 08/14/21 0842  SpO2 100 % 08/14/21 0842  Vitals shown include unvalidated device data.  Last Pain:  Vitals:   08/14/21 0615  TempSrc: Oral  PainSc: 0-No pain      Patients Stated Pain Goal: 6 (08/14/21 0615)  Complications: No notable events documented.

## 2021-08-14 NOTE — Anesthesia Postprocedure Evaluation (Signed)
Anesthesia Post Note  Patient: Jacqueline Meadows  Procedure(s) Performed: LAPAROSCOPIC TUBAL LIGATION WITH FILSHIE CLIPS (Bilateral: Abdomen)     Patient location during evaluation: PACU Anesthesia Type: General Level of consciousness: awake and alert Pain management: pain level controlled Vital Signs Assessment: post-procedure vital signs reviewed and stable Respiratory status: spontaneous breathing, nonlabored ventilation and respiratory function stable Cardiovascular status: stable and blood pressure returned to baseline Anesthetic complications: no   No notable events documented.  Last Vitals:  Vitals:   08/14/21 0945 08/14/21 1048  BP: (!) 129/93 112/65  Pulse: 83 71  Resp: 17 14  Temp:  36.6 C  SpO2: 100% 100%                   Beryle Lathe

## 2021-08-14 NOTE — Progress Notes (Signed)
No change to H&P.  Patient reports no unprotected intercourse since last delivery.  She is certain she has completed child bearing.  Mitchel Honour, DO

## 2021-08-14 NOTE — Op Note (Signed)
PREOPERATIVE DIAGNOSES: 1. Desires permanent sterilization   POSTOPERATIVE DIAGNOSES: 1. Same   PROCEDURE PERFORMED: Bilateral tubal ligation via Filshie clips   SURGEON: Dr. Linda Hedges ASSISTANT: None   ANESTHESIA: General    ESTIMATED BLOOD LOSS: 25 cc   COMPLICATIONS: Uterine perforation with Hulka manipulator   TUBES: None.   DRAINS: None   PATHOLOGY: N/A   FINDINGS: On exam, under anesthesia, normal appearing vulva and vagina, a normal sized uterus    Operative findings demonstrated a  normal uterus with fundal perforation by Hulka manipulator; hemostatic by the end of the procedure. Normal fallopian tubes and ovaries bilaterally.  The bowel and omentum were normal appearing.   Procedure: A general anesthesia was induced and the patient was placed in the dorsal lithotomy position. The abdomen, perineum, and vagina were prepped and draped in the usual fashion. The bladder was drained. After the initial preparation, the procedure commenced at the vagina. With a speculum in place to visualize the cervix, the cervix was grasped and a tenaculum. Hulka was placed for manipulation purposes.  Attention was then turned to the abdomen.    An infraumbilical incision was made and the Veress needle was gently advanced taking care to feel for the typical sensation of penetrating the peritoneum. With CO2 infiltration, an opening pressure of 45mmHg was noted, and following this, a pneumoperitoneum of 15 mmHg was created. A 11 mm trocar was then passed through the same incision and the laparoscope was then inserted through the trocar sleeve.  Visualization of the peritoneal cavity was then obtained.  Fundal perforation with the Hulka was noted.  Uterine manipulator was removed.  Acorn manipulator was then placed.  2g Ancef was given for prophylaxis.  Brief inspection did not reveal any signs of complications from entry. Once the placement of the port was complete, the actual laparoscopic  procedure began. Above operative findings noted.  Once confirmation of right fallopian tube done, filschie clip introduced via the operative scope and placed across the entire width of the fallopian tube around 3 cm from the cornual region with good blanching around it. Tube thoroughly inspected and proper placement noted. The same thing was done on the left fallopian tube. Hemostasis was noted, including at site of fundal perforation, and a complete inspection confirmed proper placement bilaterally.    All gas removed from abdomen and all instruments were removed. Fascia was closed with 0' vicryl and skin closed with 4'0 monocryl. The sponge and lap counts were correct times 2 at this time.  Jacobs tenaculum was removed.    The patient's procedure was terminated. We then awakened her. She was sent to the Recovery Room in good condition.

## 2021-08-14 NOTE — Discharge Instructions (Addendum)
Call MD for T>100.4, heavy vaginal bleeding, severe abdominal pain, intractable nausea and/or vomiting or respiratory distress.  Call office to schedule postop appointment in 2 weeks.  Pelvic rest x 4 weeks.  No driving while taking narcotics.  Use contraception x 6 wees.  Post Anesthesia Home Care Instructions  Activity: Get plenty of rest for the remainder of the day. A responsible adult should stay with you for 24 hours following the procedure.  For the next 24 hours, DO NOT: -Drive a car -Paediatric nurse -Drink alcoholic beverages -Take any medication unless instructed by your physician -Make any legal decisions or sign important papers.  Meals: Start with liquid foods such as gelatin or soup. Progress to regular foods as tolerated. Avoid greasy, spicy, heavy foods. If nausea and/or vomiting occur, drink only clear liquids until the nausea and/or vomiting subsides. Call your physician if vomiting continues.  Special Instructions/Symptoms: Your throat may feel dry or sore from the anesthesia or the breathing tube placed in your throat during surgery. If this causes discomfort, gargle with warm salt water. The discomfort should disappear within 24 hours.       DISCHARGE INSTRUCTIONS: Laparoscopy  The following instructions have been prepared to help you care for yourself upon your return home today.  Wound care:  Do not get the incision wet for the first 24 hours. The incision should be kept clean and dry.  The Band-Aids or dressings may be removed the day after surgery.  Should the incision become sore, red, and swollen after the first week, check with your doctor.  Personal hygiene:  Shower the day after your procedure.  Activity and limitations:  Do NOT drive or operate any equipment today.  Do NOT lift anything more than 15 pounds for 2-3 weeks after surgery.  Do NOT rest in bed all day.  Walking is encouraged. Walk each day, starting slowly with 5-minute walks 3 or 4  times a day. Slowly increase the length of your walks.  Walk up and down stairs slowly.  Do NOT do strenuous activities, such as golfing, playing tennis, bowling, running, biking, weight lifting, gardening, mowing, or vacuuming for 2-4 weeks. Ask your doctor when it is okay to start.  Diet: Eat a light meal as desired this evening. You may resume your usual diet tomorrow.  Return to work: This is dependent on the type of work you do. For the most part you can return to a desk job within a week of surgery. If you are more active at work, please discuss this with your doctor.  What to expect after your surgery: You may have a slight burning sensation when you urinate on the first day. You may have a very small amount of blood in the urine. Expect to have a small amount of vaginal discharge/light bleeding for 1-2 weeks. It is not unusual to have abdominal soreness and bruising for up to 2 weeks. You may be tired and need more rest for about 1 week. You may experience shoulder pain for 24-72 hours. Lying flat in bed may relieve it.  Call your doctor for any of the following:  Develop a fever of 100.4 or greater  Inability to urinate 6 hours after discharge from hospital  Severe pain not relieved by pain medications  Persistent of heavy bleeding at incision site  Redness or swelling around incision site after a week  Increasing nausea or vomiting  Patient Signature________________________________________ Nurse Signature_________________________________________

## 2021-08-14 NOTE — Anesthesia Procedure Notes (Signed)
Procedure Name: Intubation Date/Time: 08/14/2021 7:41 AM Performed by: Georgeanne Nim, CRNA Pre-anesthesia Checklist: Patient identified, Emergency Drugs available, Suction available, Patient being monitored and Timeout performed Patient Re-evaluated:Patient Re-evaluated prior to induction Oxygen Delivery Method: Circle system utilized Preoxygenation: Pre-oxygenation with 100% oxygen Induction Type: IV induction Ventilation: Mask ventilation without difficulty Laryngoscope Size: Mac and 3 Grade View: Grade I Tube size: 7.0 mm Number of attempts: 1 Airway Equipment and Method: Stylet Placement Confirmation: ETT inserted through vocal cords under direct vision, positive ETCO2, CO2 detector and breath sounds checked- equal and bilateral Secured at: 21 cm Tube secured with: Tape Dental Injury: Teeth and Oropharynx as per pre-operative assessment

## 2021-08-17 ENCOUNTER — Encounter (HOSPITAL_BASED_OUTPATIENT_CLINIC_OR_DEPARTMENT_OTHER): Payer: Self-pay | Admitting: Obstetrics & Gynecology

## 2022-01-25 DIAGNOSIS — M542 Cervicalgia: Secondary | ICD-10-CM | POA: Diagnosis not present

## 2022-02-11 ENCOUNTER — Encounter: Payer: Self-pay | Admitting: Neurology

## 2022-04-09 ENCOUNTER — Ambulatory Visit: Payer: Medicaid Other | Admitting: Neurology

## 2022-06-15 DIAGNOSIS — R319 Hematuria, unspecified: Secondary | ICD-10-CM | POA: Diagnosis not present

## 2022-06-15 DIAGNOSIS — N760 Acute vaginitis: Secondary | ICD-10-CM | POA: Diagnosis not present

## 2022-06-15 DIAGNOSIS — R309 Painful micturition, unspecified: Secondary | ICD-10-CM | POA: Diagnosis not present

## 2022-06-23 DIAGNOSIS — R102 Pelvic and perineal pain: Secondary | ICD-10-CM | POA: Diagnosis not present

## 2022-07-28 DIAGNOSIS — R3915 Urgency of urination: Secondary | ICD-10-CM | POA: Diagnosis not present

## 2022-08-24 DIAGNOSIS — E063 Autoimmune thyroiditis: Secondary | ICD-10-CM | POA: Diagnosis not present

## 2022-08-26 DIAGNOSIS — N83202 Unspecified ovarian cyst, left side: Secondary | ICD-10-CM | POA: Diagnosis not present

## 2022-09-07 DIAGNOSIS — M62838 Other muscle spasm: Secondary | ICD-10-CM | POA: Diagnosis not present

## 2022-09-07 DIAGNOSIS — R102 Pelvic and perineal pain: Secondary | ICD-10-CM | POA: Diagnosis not present

## 2022-09-07 DIAGNOSIS — R300 Dysuria: Secondary | ICD-10-CM | POA: Diagnosis not present

## 2022-09-16 DIAGNOSIS — N920 Excessive and frequent menstruation with regular cycle: Secondary | ICD-10-CM | POA: Diagnosis not present

## 2022-09-16 DIAGNOSIS — N939 Abnormal uterine and vaginal bleeding, unspecified: Secondary | ICD-10-CM | POA: Diagnosis not present

## 2022-10-21 DIAGNOSIS — R102 Pelvic and perineal pain: Secondary | ICD-10-CM | POA: Diagnosis not present

## 2022-10-21 DIAGNOSIS — R3915 Urgency of urination: Secondary | ICD-10-CM | POA: Diagnosis not present

## 2022-11-09 ENCOUNTER — Encounter (HOSPITAL_BASED_OUTPATIENT_CLINIC_OR_DEPARTMENT_OTHER): Payer: Self-pay | Admitting: Obstetrics & Gynecology

## 2022-11-09 ENCOUNTER — Other Ambulatory Visit: Payer: Self-pay

## 2022-11-09 NOTE — Progress Notes (Signed)
Your procedure is scheduled on Thursday, 12/02/2022.  Report to [facility] AT  5:30 AM.   Call this number if you have problems the morning of surgery  :[phone].   OUR ADDRESS IS [address].  WE ARE LOCATED IN [facility].  PLEASE BRING YOUR INSURANCE CARD AND PHOTO ID DAY OF SURGERY.  ONLY 2 PEOPLE ARE ALLOWED IN  WAITING  ROOM                                      REMEMBER:  DO NOT EAT FOOD, CANDY GUM OR MINTS  AFTER MIDNIGHT THE NIGHT BEFORE YOUR SURGERY . YOU MAY HAVE CLEAR LIQUIDS FROM MIDNIGHT THE NIGHT BEFORE YOUR SURGERY UNTIL  4:30 AM. NO CLEAR LIQUIDS AFTER   4:30 AM DAY OF SURGERY.  YOU MAY  BRUSH YOUR TEETH MORNING OF SURGERY AND RINSE YOUR MOUTH OUT, NO CHEWING GUM CANDY OR MINTS.     CLEAR LIQUID DIET    Allowed      Water                                                                   Coffee and tea, regular and decaf  (NO cream or milk products of any type, may sweeten)                         Carbonated beverages, regular and diet                                    Sports drinks like Gatorade _____________________________________________________________________     TAKE ONLY THESE MEDICATIONS MORNING OF SURGERY: Vesicare                                        DO NOT WEAR JEWERLY/  METAL/  PIERCINGS (INCLUDING NO PLASTIC PIERCINGS) DO NOT WEAR LOTIONS, POWDERS, PERFUMES OR NAIL POLISH ON YOUR FINGERNAILS. TOENAIL POLISH IS OK TO WEAR. DO NOT SHAVE FOR 48 HOURS PRIOR TO DAY OF SURGERY.  CONTACTS, GLASSES, OR DENTURES MAY NOT BE WORN TO SURGERY.  REMEMBER: NO SMOKING, VAPING ,  DRUGS OR ALCOHOL FOR 24 HOURS BEFORE YOUR SURGERY.                                    [facility] IS NOT RESPONSIBLE  FOR ANY BELONGINGS.                                                                    [name] Kitchen           [place] - Preparing for Surgery Before surgery,  you can play an important role.  Because skin is not sterile,  your skin needs to be as free of germs as possible.  You can reduce the number of germs on your skin by washing with CHG (chlorahexidine gluconate) soap before surgery.  CHG is an antiseptic cleaner which kills germs and bonds with the skin to continue killing germs even after washing. Please DO NOT use if you have an allergy to CHG or antibacterial soaps.  If your skin becomes reddened/irritated stop using the CHG and inform your nurse when you arrive at Short Stay. Do not shave (including legs and underarms) for at least 48 hours prior to the first CHG shower.  You may shave your face/neck. Please follow these instructions carefully:  1.  Shower with CHG Soap the night before surgery and the  morning of Surgery.  2.  If you choose to wash your hair, wash your hair first as usual with your  normal  shampoo.  3.  After you shampoo, rinse your hair and body thoroughly to remove the  shampoo.                                        4.  Use CHG as you would any other liquid soap.  You can apply chg directly  to the skin and wash , chg soap provided, night before and morning of your surgery.  5.  Apply the CHG Soap to your body ONLY FROM THE NECK DOWN.   Do not use on face/ open                           Wound or open sores. Avoid contact with eyes, ears mouth and genitals (private parts).                       Wash face,  Genitals (private parts) with your normal soap.             6.  Wash thoroughly, paying special attention to the area where your surgery  will be performed.  7.  Thoroughly rinse your body with warm water from the neck down.  8.  DO NOT shower/wash with your normal soap after using and rinsing off  the CHG Soap.             9.  Pat yourself dry with a clean towel.            10.  Wear clean pajamas.            11.  Place clean sheets on your bed the night of your first shower and do not  sleep with pets. Day of Surgery : Do not apply any lotions/ powders the morning of surgery.   Please wear clean clothes to the hospital/surgery center.  IF YOU HAVE ANY SKIN IRRITATION OR PROBLEMS WITH THE SURGICAL SOAP, PLEASE GET A BAR OF GOLD DIAL SOAP AND SHOWER THE NIGHT BEFORE YOUR SURGERY AND THE MORNING OF YOUR SURGERY. PLEASE LET THE NURSE KNOW MORNING OF YOUR SURGERY IF YOU HAD ANY PROBLEMS WITH THE SURGICAL SOAP.   YOUR SURGEON MAY HAVE REQUESTED EXTENDED RECOVERY TIME AFTER YOUR SURGERY. IT COULD BE A  JUST A FEW HOURS  UP TO AN OVERNIGHT STAY.  YOUR SURGEON SHOULD HAVE DISCUSSED THIS WITH YOU PRIOR TO YOUR SURGERY.  IN THE EVENT YOU NEED TO STAY OVERNIGHT PLEASE REFER TO THE FOLLOWING GUIDELINES. YOU MAY HAVE UP TO 4 VISITORS  MAY VISIT IN THE EXTENDED RECOVERY ROOM UNTIL 800 PM ONLY.  ONE  VISITOR AGE 42 AND OVER MAY SPEND THE NIGHT AND MUST BE IN EXTENDED RECOVERY ROOM NO LATER THAN 800 PM . YOUR DISCHARGE TIME AFTER YOU SPEND THE NIGHT IS 900 AM THE MORNING AFTER YOUR SURGERY. YOU MAY PACK A SMALL OVERNIGHT BAG WITH TOILETRIES FOR YOUR OVERNIGHT STAY IF YOU WISH.  REGARDLESS OF IF YOU STAY OVER NIGHT OR ARE DISCHARGED THE SAME DAY YOU WILL BE REQUIRED TO HAVE A RESPONSIBLE ADULT (18 YRS OLD OR OLDER) STAY WITH YOU FOR AT LEAST THE FIRST 24 HOURS  YOUR PRESCRIPTION MEDICATIONS WILL BE PROVIDED DURING YOUR HOSPITAL STAY.  ________________________________________________________________________                                                        QUESTIONS Mechele Claude PRE OP NURSE PHONE [phone].

## 2022-11-09 NOTE — Progress Notes (Signed)
Spoke w/ via phone for pre-op interview---[facility] needs dos----urine pregnancy               Lab results------11/30/22 lab appt for cbc, type & screen, cmp COVID test -----patient states asymptomatic no test needed Arrive at -------0530 on Thursday, 12/02/22 NPO after MN NO Solid Food.  Clear liquids from MN until---0430 Med rec completed Medications to take morning of surgery -----Vesicare Diabetic medication ----- Patient instructed no nail polish to be worn day of surgery Patient instructed to bring photo id and insurance card day of surgery Patient aware to have Driver (ride ) / caregiver    for 24 hours after surgery - boyfriend , Junior Patient Special Instructions -----Extended / overnight stay instructions given. Pre-Op special Instructions -----none Patient verbalized understanding of instructions that were given at this phone interview. Patient denies shortness of breath, chest pain, fever, cough at this phone interview.

## 2022-11-25 DIAGNOSIS — N939 Abnormal uterine and vaginal bleeding, unspecified: Secondary | ICD-10-CM | POA: Diagnosis not present

## 2022-11-25 NOTE — H&P (Signed)
Jacqueline Meadows is an 22 y.o. female G3P3 s/p BTL who has long-term menorrhagia and dysmenorrhea which is severely affecting her quality of life.  Patient refuses hormonal treatment.  U/S 08/26/22 with 108 cc uterine volume and no uterine abnormality.  Patient strongly desire definitive management.   Pertinent Gynecological History: Menses: flow is excessive with use of 8 pads or tampons on heaviest days Bleeding: dysfunctional uterine bleeding Contraception: tubal ligation DES exposure: unknown Blood transfusions: none Sexually transmitted diseases: no past history Previous GYN Procedures:  BTL    Last mammogram:  n/a  Date: n/a Last pap:  ASCUS neg HRHPV  Date: 2023 OB History: G3, P3   Menstrual History: Menarche age: n/a Patient's last menstrual period was 10/12/2022 (exact date).    Past Medical History:  Diagnosis Date   Anxiety    hx of anxiety, resolved   Asthma    as a child   Frequent urination    Follows w/ Dr. Mena Goes, Urology.   Hypothyroidism    Follows w/ Fredia Sorrow, NP, endocrinology for Hashimoto's. As of 2024, patient is no longer on meds.   Migraine    hx of migraines as a child   Premature baby 2020   @ 36 weeks    Past Surgical History:  Procedure Laterality Date   FOOT SURGERY Bilateral 03-2015   Rods in both feet   LAPAROSCOPIC TUBAL LIGATION Bilateral 08/14/2021   Procedure: LAPAROSCOPIC TUBAL LIGATION WITH FILSHIE CLIPS;  Surgeon: Mitchel Honour, DO;  Location: [facility];  Service: Gynecology;  Laterality: Bilateral;    Family History  Problem Relation Age of Onset   Anxiety disorder Mother    Depression Mother    ADD / ADHD Mother    Bipolar disorder Mother    Migraines Mother    Hearing loss Mother    Anxiety disorder Brother    Depression Brother    Hearing loss Brother    Bipolar disorder Paternal Aunt        3 paternal aunts bipolar dx and on disability   Suicidality Paternal Aunt        paternal aunt attempted  suicide 2 times   Other Paternal Aunt        Hx of substance abuse   Cancer Paternal Aunt    ADD / ADHD Brother    Cancer Maternal Grandmother        ovarian uterine colon   Ovarian cancer Maternal Aunt    Cancer Paternal Uncle     Social History:  reports that she has never smoked. She has never used smokeless tobacco. She reports that she does not drink alcohol and does not use drugs.  Allergies:  Allergies  Allergen Reactions   Vicodin [Hydrocodone-Acetaminophen] Rash    Pt states she can take Oxycodone and plain tylenol     No medications prior to admission.    Review of Systems  Height 5\' 6"  (1.676 m), weight 48.1 kg, last menstrual period 10/12/2022, not currently breastfeeding. Physical Exam Constitutional:      Appearance: Normal appearance.  HENT:     Head: Normocephalic and atraumatic.  Pulmonary:     Effort: Pulmonary effort is normal.  Abdominal:     Palpations: Abdomen is soft.  Musculoskeletal:        General: Normal range of motion.     Cervical back: Normal range of motion.  Skin:    General: Skin is warm and dry.  Neurological:     Mental Status:  She is alert and oriented to person, place, and time.  Psychiatric:        Mood and Affect: Mood normal.        Behavior: Behavior normal.     No results found for this or any previous visit (from the past 24 hour(s)).  No results found.  Assessment/Plan: 22yo G3P3 with menorrhagia and dysmenorrhea desiring definitive management -LAVH, BS -Patient is counseled re: risk of bleeding, infection, scarring and damage to surrounding structures. She is informed of steps of procedure as well as postop expectations and limitations. I strongly recommend leaving ovaries in place given her young age and decades until menopause. She understands this will leave the possibility of ovarian cyst formation and agrees to leave ovaries unless abnormality. Patient would like to proceed with scheduling LAVH, BS.  Mitchel Honour 11/25/2022, 8:44 AM

## 2022-11-30 ENCOUNTER — Encounter (HOSPITAL_COMMUNITY)
Admission: RE | Admit: 2022-11-30 | Discharge: 2022-11-30 | Disposition: A | Discharge status: Home / Self Care | Payer: BC Managed Care – PPO | Source: Ambulatory Visit | Attending: Obstetrics & Gynecology | Admitting: Obstetrics & Gynecology

## 2022-11-30 DIAGNOSIS — Z01818 Encounter for other preprocedural examination: Secondary | ICD-10-CM | POA: Diagnosis not present

## 2022-11-30 DIAGNOSIS — N921 Excessive and frequent menstruation with irregular cycle: Secondary | ICD-10-CM | POA: Insufficient documentation

## 2022-11-30 LAB — TYPE AND SCREEN: ABO/RH(D): A NEG

## 2022-11-30 LAB — CBC
HCT: 38.2 % (ref 36.0–46.0)
Hemoglobin: 12.5 g/dL (ref 12.0–15.0)
MCH: 29.7 pg (ref 26.0–34.0)
MCHC: 32.7 g/dL (ref 30.0–36.0)
MCV: 90.7 fL (ref 80.0–100.0)
Platelets: 200 10*3/uL (ref 150–400)
RBC: 4.21 MIL/uL (ref 3.87–5.11)
RDW: 13 % (ref 11.5–15.5)
WBC: 6.4 10*3/uL (ref 4.0–10.5)
nRBC: 0 % (ref 0.0–0.2)

## 2022-11-30 LAB — COMPREHENSIVE METABOLIC PANEL
ALT: 20 U/L (ref 0–44)
AST: 22 U/L (ref 15–41)
Albumin: 4.1 g/dL (ref 3.5–5.0)
Alkaline Phosphatase: 49 U/L (ref 38–126)
Anion gap: 8 (ref 5–15)
BUN: 16 mg/dL (ref 6–20)
CO2: 26 mmol/L (ref 22–32)
Calcium: 8.8 mg/dL — ABNORMAL LOW (ref 8.9–10.3)
Chloride: 104 mmol/L (ref 98–111)
Creatinine, Ser: 0.87 mg/dL (ref 0.44–1.00)
GFR, Estimated: >60 mL/min (ref >60)
Glucose, Bld: 95 mg/dL (ref 70–99)
Potassium: 3.8 mmol/L (ref 3.5–5.1)
Sodium: 138 mmol/L (ref 135–145)
Total Bilirubin: 0.6 mg/dL (ref 0.3–1.2)
Total Protein: 7.2 g/dL (ref 6.5–8.1)

## 2022-12-02 ENCOUNTER — Observation Stay (HOSPITAL_BASED_OUTPATIENT_CLINIC_OR_DEPARTMENT_OTHER): Payer: BC Managed Care – PPO | Admitting: Anesthesiology

## 2022-12-02 ENCOUNTER — Encounter (HOSPITAL_BASED_OUTPATIENT_CLINIC_OR_DEPARTMENT_OTHER)
Admission: RE | Disposition: A | Discharge status: Home / Self Care | Payer: Self-pay | Source: Home / Self Care | Attending: Obstetrics & Gynecology

## 2022-12-02 ENCOUNTER — Observation Stay (HOSPITAL_BASED_OUTPATIENT_CLINIC_OR_DEPARTMENT_OTHER)
Admission: RE | Admit: 2022-12-02 | Discharge: 2022-12-03 | Disposition: A | Discharge status: Home / Self Care | Payer: BC Managed Care – PPO | Attending: Obstetrics & Gynecology | Admitting: Obstetrics & Gynecology

## 2022-12-02 ENCOUNTER — Encounter (HOSPITAL_BASED_OUTPATIENT_CLINIC_OR_DEPARTMENT_OTHER): Payer: Self-pay | Admitting: Obstetrics & Gynecology

## 2022-12-02 ENCOUNTER — Other Ambulatory Visit: Payer: Self-pay

## 2022-12-02 DIAGNOSIS — J45909 Unspecified asthma, uncomplicated: Secondary | ICD-10-CM | POA: Diagnosis not present

## 2022-12-02 DIAGNOSIS — N920 Excessive and frequent menstruation with regular cycle: Principal | ICD-10-CM | POA: Diagnosis present

## 2022-12-02 DIAGNOSIS — N946 Dysmenorrhea, unspecified: Secondary | ICD-10-CM | POA: Insufficient documentation

## 2022-12-02 DIAGNOSIS — N921 Excessive and frequent menstruation with irregular cycle: Secondary | ICD-10-CM

## 2022-12-02 DIAGNOSIS — Z79899 Other long term (current) drug therapy: Secondary | ICD-10-CM | POA: Diagnosis not present

## 2022-12-02 DIAGNOSIS — Z90710 Acquired absence of both cervix and uterus: Secondary | ICD-10-CM | POA: Diagnosis present

## 2022-12-02 DIAGNOSIS — N858 Other specified noninflammatory disorders of uterus: Secondary | ICD-10-CM | POA: Diagnosis not present

## 2022-12-02 DIAGNOSIS — E039 Hypothyroidism, unspecified: Secondary | ICD-10-CM | POA: Diagnosis not present

## 2022-12-02 DIAGNOSIS — N944 Primary dysmenorrhea: Secondary | ICD-10-CM | POA: Diagnosis not present

## 2022-12-02 DIAGNOSIS — Z01818 Encounter for other preprocedural examination: Secondary | ICD-10-CM

## 2022-12-02 HISTORY — DX: Frequency of micturition: R35.0

## 2022-12-02 HISTORY — PX: LAPAROSCOPIC VAGINAL HYSTERECTOMY WITH SALPINGECTOMY: SHX6680

## 2022-12-02 HISTORY — PX: CYSTOSCOPY: SHX5120

## 2022-12-02 HISTORY — DX: Hypothyroidism, unspecified: E03.9

## 2022-12-02 LAB — POCT PREGNANCY, URINE: Preg Test, Ur: NEGATIVE

## 2022-12-02 LAB — TYPE AND SCREEN: Antibody Screen: NEGATIVE

## 2022-12-02 SURGERY — HYSTERECTOMY, VAGINAL, LAPAROSCOPY-ASSISTED, WITH SALPINGECTOMY
Anesthesia: General | Laterality: Bilateral

## 2022-12-02 MED ORDER — DIPHENHYDRAMINE HCL 25 MG PO CAPS
25.0000 mg | ORAL_CAPSULE | Freq: Four times a day (QID) | ORAL | Status: DC | PRN
  Administered 2022-12-02: 25 mg via ORAL

## 2022-12-02 MED ORDER — SUGAMMADEX SODIUM 200 MG/2ML IV SOLN
INTRAVENOUS | Status: DC | PRN
  Administered 2022-12-02: 100 mg via INTRAVENOUS

## 2022-12-02 MED ORDER — LACTATED RINGERS IV SOLN: INTRAVENOUS | Status: DC

## 2022-12-02 MED ORDER — OXYCODONE HCL 5 MG PO TABS
5.0000 mg | ORAL_TABLET | ORAL | Status: DC | PRN
  Administered 2022-12-02 – 2022-12-03 (×5): 10 mg via ORAL

## 2022-12-02 MED ORDER — BUPIVACAINE HCL (PF) 0.25 % IJ SOLN
INTRAMUSCULAR | Status: DC | PRN
  Administered 2022-12-02: 6 mL

## 2022-12-02 MED ORDER — KETOROLAC TROMETHAMINE 30 MG/ML IJ SOLN
INTRAMUSCULAR | Status: DC | PRN
  Administered 2022-12-02: 30 mg via INTRAVENOUS

## 2022-12-02 MED ORDER — AMISULPRIDE (ANTIEMETIC) 5 MG/2ML IV SOLN: 10.0000 mg | Freq: Once | INTRAVENOUS | Status: DC | PRN

## 2022-12-02 MED ORDER — FLUORESCEIN SODIUM 10 % IV SOLN
INTRAVENOUS | Status: AC
  Filled 2022-12-02: qty 5

## 2022-12-02 MED ORDER — FLUORESCEIN SODIUM 10 % IV SOLN
INTRAVENOUS | Status: DC | PRN
  Administered 2022-12-02: 1 mL via INTRAVENOUS

## 2022-12-02 MED ORDER — HYDROMORPHONE HCL 1 MG/ML IJ SOLN
0.2000 mg | INTRAMUSCULAR | Status: DC | PRN
  Administered 2022-12-02 (×2): 0.5 mg via INTRAVENOUS

## 2022-12-02 MED ORDER — OXYCODONE HCL 5 MG PO TABS
ORAL_TABLET | ORAL | Status: AC
  Filled 2022-12-02: qty 2

## 2022-12-02 MED ORDER — ACETAMINOPHEN 500 MG PO TABS
ORAL_TABLET | ORAL | Status: AC
  Filled 2022-12-02: qty 2

## 2022-12-02 MED ORDER — SODIUM CHLORIDE 0.9 % IR SOLN
Status: DC | PRN
  Administered 2022-12-02: 1000 mL
  Administered 2022-12-02: 1000 mL via INTRAVESICAL
  Administered 2022-12-02: 3000 mL via INTRAVESICAL

## 2022-12-02 MED ORDER — FENTANYL CITRATE (PF) 100 MCG/2ML IJ SOLN
INTRAMUSCULAR | Status: DC | PRN
  Administered 2022-12-02: 50 ug via INTRAVENOUS
  Administered 2022-12-02 (×2): 25 ug via INTRAVENOUS

## 2022-12-02 MED ORDER — OXYCODONE HCL 5 MG/5ML PO SOLN: 5.0000 mg | Freq: Once | ORAL | Status: DC | PRN

## 2022-12-02 MED ORDER — KETOROLAC TROMETHAMINE 30 MG/ML IJ SOLN
INTRAMUSCULAR | Status: AC
  Filled 2022-12-02: qty 1

## 2022-12-02 MED ORDER — DOCUSATE SODIUM 100 MG PO CAPS
100.0000 mg | ORAL_CAPSULE | Freq: Two times a day (BID) | ORAL | Status: DC
  Administered 2022-12-02 (×2): 100 mg via ORAL

## 2022-12-02 MED ORDER — KETOROLAC TROMETHAMINE 15 MG/ML IJ SOLN
15.0000 mg | Freq: Four times a day (QID) | INTRAMUSCULAR | Status: DC | PRN
  Administered 2022-12-02 – 2022-12-03 (×3): 15 mg via INTRAVENOUS

## 2022-12-02 MED ORDER — HYDROMORPHONE HCL 1 MG/ML IJ SOLN
0.2500 mg | INTRAMUSCULAR | Status: DC | PRN
  Administered 2022-12-02 (×3): 0.25 mg via INTRAVENOUS

## 2022-12-02 MED ORDER — SODIUM CHLORIDE 0.9 % IV SOLN
25.0000 mg | Freq: Four times a day (QID) | INTRAVENOUS | Status: DC | PRN
  Administered 2022-12-02: 25 mg via INTRAVENOUS
  Filled 2022-12-02: qty 25

## 2022-12-02 MED ORDER — DEXAMETHASONE SODIUM PHOSPHATE 10 MG/ML IJ SOLN
INTRAMUSCULAR | Status: DC | PRN
  Administered 2022-12-02: 10 mg via INTRAVENOUS

## 2022-12-02 MED ORDER — KETOROLAC TROMETHAMINE 15 MG/ML IJ SOLN
INTRAMUSCULAR | Status: AC
  Filled 2022-12-02: qty 1

## 2022-12-02 MED ORDER — LIDOCAINE 2% (20 MG/ML) 5 ML SYRINGE
INTRAMUSCULAR | Status: DC | PRN
  Administered 2022-12-02: 60 mg via INTRAVENOUS

## 2022-12-02 MED ORDER — HYDROMORPHONE HCL 1 MG/ML IJ SOLN
INTRAMUSCULAR | Status: DC | PRN
  Administered 2022-12-02 (×2): .5 mg via INTRAVENOUS
  Administered 2022-12-02: 1 mg via INTRAVENOUS

## 2022-12-02 MED ORDER — HYDROMORPHONE HCL 2 MG/ML IJ SOLN
INTRAMUSCULAR | Status: AC
  Filled 2022-12-02: qty 1

## 2022-12-02 MED ORDER — SIMETHICONE 80 MG PO CHEW
CHEWABLE_TABLET | ORAL | Status: AC
  Filled 2022-12-02: qty 1

## 2022-12-02 MED ORDER — SIMETHICONE 80 MG PO CHEW
80.0000 mg | CHEWABLE_TABLET | Freq: Four times a day (QID) | ORAL | Status: DC | PRN
  Administered 2022-12-02: 80 mg via ORAL

## 2022-12-02 MED ORDER — DOCUSATE SODIUM 100 MG PO CAPS
ORAL_CAPSULE | ORAL | Status: AC
  Filled 2022-12-02: qty 1

## 2022-12-02 MED ORDER — DEXAMETHASONE SODIUM PHOSPHATE 10 MG/ML IJ SOLN
INTRAMUSCULAR | Status: AC
  Filled 2022-12-02: qty 1

## 2022-12-02 MED ORDER — PHENYLEPHRINE 80 MCG/ML (10ML) SYRINGE FOR IV PUSH (FOR BLOOD PRESSURE SUPPORT)
PREFILLED_SYRINGE | INTRAVENOUS | Status: DC | PRN
  Administered 2022-12-02: 80 ug via INTRAVENOUS

## 2022-12-02 MED ORDER — HYDROMORPHONE HCL 1 MG/ML IJ SOLN
INTRAMUSCULAR | Status: AC
  Filled 2022-12-02: qty 1

## 2022-12-02 MED ORDER — ONDANSETRON HCL 4 MG/2ML IJ SOLN
INTRAMUSCULAR | Status: AC
  Filled 2022-12-02: qty 2

## 2022-12-02 MED ORDER — CEFAZOLIN SODIUM-DEXTROSE 2-4 GM/100ML-% IV SOLN
INTRAVENOUS | Status: AC
  Filled 2022-12-02: qty 100

## 2022-12-02 MED ORDER — DIPHENHYDRAMINE HCL 25 MG PO CAPS
ORAL_CAPSULE | ORAL | Status: AC
  Filled 2022-12-02: qty 1

## 2022-12-02 MED ORDER — CEFAZOLIN SODIUM-DEXTROSE 2-4 GM/100ML-% IV SOLN
2.0000 g | INTRAVENOUS | Status: AC
  Administered 2022-12-02: 2 g via INTRAVENOUS

## 2022-12-02 MED ORDER — PHENYLEPHRINE 80 MCG/ML (10ML) SYRINGE FOR IV PUSH (FOR BLOOD PRESSURE SUPPORT)
PREFILLED_SYRINGE | INTRAVENOUS | Status: AC
  Filled 2022-12-02: qty 10

## 2022-12-02 MED ORDER — DEXMEDETOMIDINE HCL IN NACL 80 MCG/20ML IV SOLN
INTRAVENOUS | Status: DC | PRN
  Administered 2022-12-02 (×2): 4 ug via INTRAVENOUS

## 2022-12-02 MED ORDER — PROPOFOL 10 MG/ML IV BOLUS
INTRAVENOUS | Status: AC
  Filled 2022-12-02: qty 20

## 2022-12-02 MED ORDER — ROCURONIUM BROMIDE 10 MG/ML (PF) SYRINGE
PREFILLED_SYRINGE | INTRAVENOUS | Status: AC
  Filled 2022-12-02: qty 10

## 2022-12-02 MED ORDER — MIDAZOLAM HCL 5 MG/5ML IJ SOLN
INTRAMUSCULAR | Status: DC | PRN
  Administered 2022-12-02: 2 mg via INTRAVENOUS

## 2022-12-02 MED ORDER — PROPOFOL 10 MG/ML IV BOLUS
INTRAVENOUS | Status: DC | PRN
  Administered 2022-12-02: 160 mg via INTRAVENOUS
  Administered 2022-12-02: 20 mg via INTRAVENOUS

## 2022-12-02 MED ORDER — MENTHOL 3 MG MT LOZG: 1.0000 | LOZENGE | OROMUCOSAL | Status: DC | PRN

## 2022-12-02 MED ORDER — FENTANYL CITRATE (PF) 100 MCG/2ML IJ SOLN
INTRAMUSCULAR | Status: AC
  Filled 2022-12-02: qty 2

## 2022-12-02 MED ORDER — MEPERIDINE HCL 25 MG/ML IJ SOLN: 6.2500 mg | INTRAMUSCULAR | Status: DC | PRN

## 2022-12-02 MED ORDER — PROMETHAZINE HCL 25 MG/ML IJ SOLN: 6.2500 mg | INTRAMUSCULAR | Status: DC | PRN

## 2022-12-02 MED ORDER — LIDOCAINE HCL (PF) 2 % IJ SOLN
INTRAMUSCULAR | Status: AC
  Filled 2022-12-02: qty 5

## 2022-12-02 MED ORDER — POVIDONE-IODINE 10 % EX SWAB: 2.0000 | Freq: Once | CUTANEOUS | Status: DC

## 2022-12-02 MED ORDER — ONDANSETRON HCL 4 MG/2ML IJ SOLN
INTRAMUSCULAR | Status: DC | PRN
  Administered 2022-12-02: 4 mg via INTRAVENOUS

## 2022-12-02 MED ORDER — ROCURONIUM BROMIDE 10 MG/ML (PF) SYRINGE
PREFILLED_SYRINGE | INTRAVENOUS | Status: DC | PRN
  Administered 2022-12-02: 10 mg via INTRAVENOUS
  Administered 2022-12-02: 50 mg via INTRAVENOUS

## 2022-12-02 MED ORDER — ACETAMINOPHEN 500 MG PO TABS
1000.0000 mg | ORAL_TABLET | Freq: Four times a day (QID) | ORAL | Status: DC
  Administered 2022-12-02 – 2022-12-03 (×4): 1000 mg via ORAL

## 2022-12-02 MED ORDER — OXYCODONE HCL 5 MG PO TABS: 5.0000 mg | ORAL_TABLET | Freq: Once | ORAL | Status: DC | PRN

## 2022-12-02 MED ORDER — MIDAZOLAM HCL 2 MG/2ML IJ SOLN
INTRAMUSCULAR | Status: AC
  Filled 2022-12-02: qty 2

## 2022-12-02 MED ORDER — ONDANSETRON HCL 4 MG/2ML IJ SOLN
4.0000 mg | Freq: Four times a day (QID) | INTRAMUSCULAR | Status: DC | PRN
  Administered 2022-12-02: 4 mg via INTRAVENOUS

## 2022-12-02 MED ORDER — ONDANSETRON HCL 4 MG PO TABS: 4.0000 mg | ORAL_TABLET | Freq: Four times a day (QID) | ORAL | Status: DC | PRN

## 2022-12-02 SURGICAL SUPPLY — 58 items
ADH SKN CLS APL DERMABOND .7 (GAUZE/BANDAGES/DRESSINGS) ×2
BLADE CLIPPER SENSICLIP SURGIC (BLADE) IMPLANT
CATH ROBINSON RED A/P 16FR (CATHETERS) ×3 IMPLANT
COVER BACK TABLE 60X90IN (DRAPES) ×3 IMPLANT
COVER MAYO STAND STRL (DRAPES) ×6 IMPLANT
DERMABOND ADVANCED .7 DNX12 (GAUZE/BANDAGES/DRESSINGS) IMPLANT
DRAPE SURG IRRIG POUCH 19X23 (DRAPES) ×3 IMPLANT
DRSG COVADERM PLUS 2X2 (GAUZE/BANDAGES/DRESSINGS) ×3 IMPLANT
DRSG OPSITE POSTOP 3X4 (GAUZE/BANDAGES/DRESSINGS) ×3 IMPLANT
DURAPREP 26ML APPLICATOR (WOUND CARE) ×3 IMPLANT
ELECT REM PT RETURN 9FT ADLT (ELECTROSURGICAL)
ELECTRODE REM PT RTRN 9FT ADLT (ELECTROSURGICAL) IMPLANT
FORCEPS CUTTING 45CM 5MM (CUTTING FORCEPS) IMPLANT
GAUZE 4X4 16PLY ~~LOC~~+RFID DBL (SPONGE) ×3 IMPLANT
GLOVE BIO SURGEON STRL SZ 6 (GLOVE) ×9 IMPLANT
GLOVE BIOGEL PI IND STRL 6 (GLOVE) ×6 IMPLANT
GLOVE ECLIPSE 6.0 STRL STRAW (GLOVE) ×9 IMPLANT
GLOVE ECLIPSE 6.5 STRL STRAW (GLOVE) ×6 IMPLANT
GLOVE SS PI  5.5 STRL (GLOVE) ×4
GLOVE SS PI 5.5 STRL (GLOVE) ×6 IMPLANT
HOLDER FOLEY CATH W/STRAP (MISCELLANEOUS) ×3 IMPLANT
IRRIGATION STRYKERFLOW (MISCELLANEOUS) IMPLANT
IRRIGATOR STRYKERFLOW (MISCELLANEOUS)
IV NS 1000ML (IV SOLUTION) ×8
IV NS 1000ML BAXH (IV SOLUTION) IMPLANT
IV NS IRRIG 3000ML ARTHROMATIC (IV SOLUTION) IMPLANT
KIT PINK PAD W/HEAD ARE REST (MISCELLANEOUS) ×4
KIT PINK PAD W/HEAD ARM REST (MISCELLANEOUS) ×6 IMPLANT
KIT TURNOVER CYSTO (KITS) ×3 IMPLANT
LIGASURE IMPACT 36 18CM CVD LR (INSTRUMENTS) IMPLANT
LIGASURE LAP L-HOOKWIRE 5 44CM (INSTRUMENTS) IMPLANT
MANIPULATOR UTERINE 4.5 ZUMI (MISCELLANEOUS) IMPLANT
NDL INSUFFLATION 14GA 120MM (NEEDLE) ×3 IMPLANT
NEEDLE INSUFFLATION 14GA 120MM (NEEDLE) ×2 IMPLANT
NS IRRIG 500ML POUR BTL (IV SOLUTION) ×3 IMPLANT
PACK LAVH (CUSTOM PROCEDURE TRAY) ×3 IMPLANT
PACK ROBOTIC GOWN (GOWN DISPOSABLE) ×3 IMPLANT
PAD OB MATERNITY 4.3X12.25 (PERSONAL CARE ITEMS) ×3 IMPLANT
PAD PREP 24X48 CUFFED NSTRL (MISCELLANEOUS) ×3 IMPLANT
SCISSORS LAP 5X35 DISP (ENDOMECHANICALS) IMPLANT
SET TUBE SMOKE EVAC HIGH FLOW (TUBING) ×3 IMPLANT
SLEEVE SCD COMPRESS KNEE MED (STOCKING) ×3 IMPLANT
SLEEVE Z-THREAD 5X100MM (TROCAR) ×6 IMPLANT
SPIKE FLUID TRANSFER (MISCELLANEOUS) IMPLANT
SUT MNCRL 0 MO-4 VIOLET 18 CR (SUTURE) ×6 IMPLANT
SUT MNCRL AB 3-0 PS2 18 (SUTURE) ×3 IMPLANT
SUT MON AB 2-0 CT1 36 (SUTURE) IMPLANT
SUT VIC AB 0 CT1 27 (SUTURE)
SUT VIC AB 0 CT1 27XCR 8 STRN (SUTURE) IMPLANT
SUT VICRYL 0 TIES 12 18 (SUTURE) ×3 IMPLANT
SUT VICRYL 0 UR6 27IN ABS (SUTURE) ×3 IMPLANT
SYR BULB IRRIG 60ML STRL (SYRINGE) IMPLANT
TOWEL OR 17X24 6PK STRL BLUE (TOWEL DISPOSABLE) ×3 IMPLANT
TRAY FOLEY W/BAG SLVR 14FR LF (SET/KITS/TRAYS/PACK) ×3 IMPLANT
TROCAR Z-THREAD FIOS 11X100 BL (TROCAR) ×3 IMPLANT
TROCAR Z-THREAD FIOS 5X100MM (TROCAR) IMPLANT
WARMER LAPAROSCOPE (MISCELLANEOUS) ×3 IMPLANT
WATER STERILE IRR 500ML POUR (IV SOLUTION) IMPLANT

## 2022-12-02 NOTE — Transfer of Care (Signed)
Immediate Anesthesia Transfer of Care Note  Patient: Jacqueline Meadows  Procedure(s) Performed: LAPAROSCOPIC ASSISTED VAGINAL HYSTERECTOMY WITH SALPINGECTOMY (Bilateral) CYSTOSCOPY  Patient Location: PACU  Anesthesia Type:General  Level of Consciousness: awake, alert , and oriented  Airway & Oxygen Therapy: Patient Spontanous Breathing and Patient connected to nasal cannula oxygen  Post-op Assessment: Report given to RN and Post -op Vital signs reviewed and stable  Post vital signs: Reviewed and stable  Last Vitals:  Vitals Value Taken Time  BP    Temp    Pulse 110 12/02/22 0959  Resp 12 12/02/22 0959  SpO2 100 % 12/02/22 0959  Vitals shown include unvalidated device data.  Last Pain:  Vitals:   12/02/22 0557  TempSrc: Oral  PainSc: 0-No pain      Patients Stated Pain Goal: 3 (12/02/22 0557)  Complications: No notable events documented.

## 2022-12-02 NOTE — Anesthesia Preprocedure Evaluation (Signed)
Anesthesia Evaluation  Patient identified by MRN, date of birth, ID band Patient awake    Reviewed: Allergy & Precautions, NPO status , Patient's Chart, lab work & pertinent test results  History of Anesthesia Complications Negative for: history of anesthetic complications  Airway Mallampati: II  TM Distance: >3 FB Neck ROM: Full    Dental  (+) Dental Advisory Given, Teeth Intact   Pulmonary asthma    Pulmonary exam normal        Cardiovascular negative cardio ROS Normal cardiovascular exam     Neuro/Psych  Headaches PSYCHIATRIC DISORDERS Anxiety        GI/Hepatic negative GI ROS, Neg liver ROS,,,  Endo/Other    Renal/GU negative Renal ROS     Musculoskeletal negative musculoskeletal ROS (+)    Abdominal   Peds  Hematology negative hematology ROS (+)   Anesthesia Other Findings   Reproductive/Obstetrics                             Anesthesia Physical Anesthesia Plan  ASA: 2  Anesthesia Plan: General   Post-op Pain Management: Dilaudid IV   Induction: Intravenous  PONV Risk Score and Plan: 3 and Treatment may vary due to age or medical condition, Ondansetron, Dexamethasone and Midazolam  Airway Management Planned: Oral ETT  Additional Equipment: None  Intra-op Plan:   Post-operative Plan: Extubation in OR  Informed Consent: I have reviewed the patients History and Physical, chart, labs and discussed the procedure including the risks, benefits and alternatives for the proposed anesthesia with the patient or authorized representative who has indicated his/her understanding and acceptance.     Dental advisory given  Plan Discussed with: CRNA and Anesthesiologist  Anesthesia Plan Comments:         Anesthesia Quick Evaluation

## 2022-12-02 NOTE — Anesthesia Procedure Notes (Signed)
Procedure Name: Intubation Date/Time: 12/02/2022 7:25 AM  Performed by: Chela Sutphen D, CRNAPre-anesthesia Checklist: Patient identified, Emergency Drugs available, Suction available and Patient being monitored Patient Re-evaluated:Patient Re-evaluated prior to induction Oxygen Delivery Method: Circle system utilized Preoxygenation: Pre-oxygenation with 100% oxygen Induction Type: IV induction Ventilation: Mask ventilation without difficulty Laryngoscope Size: Mac and 3 Tube type: Oral Tube size: 7.0 mm Number of attempts: 1 Airway Equipment and Method: Stylet and Oral airway Placement Confirmation: ETT inserted through vocal cords under direct vision, positive ETCO2 and breath sounds checked- equal and bilateral Secured at: 20 cm Tube secured with: Tape Dental Injury: Teeth and Oropharynx as per pre-operative assessment

## 2022-12-02 NOTE — Anesthesia Postprocedure Evaluation (Signed)
Anesthesia Post Note  Patient: Jacqueline Meadows  Procedure(s) Performed: LAPAROSCOPIC ASSISTED VAGINAL HYSTERECTOMY WITH SALPINGECTOMY (Bilateral) CYSTOSCOPY     Patient location during evaluation: PACU Anesthesia Type: General Level of consciousness: awake and alert Pain management: pain level controlled Vital Signs Assessment: post-procedure vital signs reviewed and stable Respiratory status: spontaneous breathing, nonlabored ventilation and respiratory function stable Cardiovascular status: blood pressure returned to baseline and stable Postop Assessment: no apparent nausea or vomiting Anesthetic complications: no   No notable events documented.  Last Vitals:  Vitals:   12/02/22 1050 12/02/22 1123  BP: 104/78 118/64  Pulse: 87 80  Resp: 14 14  Temp: 36.8 C 36.8 C  SpO2: 100% 100%    Last Pain:  Vitals:   12/02/22 1050  TempSrc:   PainSc: 6                  Lowella Curb

## 2022-12-02 NOTE — Progress Notes (Signed)
Day of Surgery Procedure(s) (LRB): LAPAROSCOPIC ASSISTED VAGINAL HYSTERECTOMY WITH SALPINGECTOMY (Bilateral) CYSTOSCOPY  Subjective: No current c/o.  Patient reports no N/V and tolerating diet.  Patient has ambulated twice and after her last walk, she required IV Dilaudid; feeling much better now.  No CP/SOB.  Objective: I have reviewed patient's vital signs, intake and output, and medications.  General: alert, cooperative, and appears stated age Extremities: extremities normal, atraumatic, no cyanosis or edema Abd: soft, ND.  Inc c/d/I x 2  Assessment: s/p Procedure(s): LAPAROSCOPIC ASSISTED VAGINAL HYSTERECTOMY WITH SALPINGECTOMY (Bilateral) CYSTOSCOPY: stable, progressing well, and tolerating diet  Plan: Advance diet Encourage ambulation Advance to PO medication Discontinue IV fluids AM labs pending   LOS: 0 days    Mitchel Honour, DO 12/02/2022, 2:01 PM

## 2022-12-02 NOTE — Progress Notes (Signed)
No change to H&P.  Raygen Linquist, DO 

## 2022-12-02 NOTE — Op Note (Addendum)
PROCEDURE DATE: 12/02/2022 PREOPERATIVE DIAGNOSIS: Menorrhagia, dysmenorrhea  POSTOPERATIVE DIAGNOSIS: The same  PROCEDURE: Laparoscopic Assisted Vaginal Hysterectomy, Bilateral Salpingectomy, cystoscopy SURGEON: Dr. Mitchel Honour  ASSISTANT: Dr. Nilda Simmer INDICATIONS: 22 y.o. G3P3 with menorrhagia and dysmenorrhea desiring definitive surgical management. Risks of surgery were discussed with the patient including but not limited to: bleeding which may require transfusion or reoperation; infection which may require antibiotics; injury to bowel, bladder, ureters or other surrounding organs; need for additional procedures including laparotomy; thromboembolic phenomenon, incisional problems and other postoperative/anesthesia complications. Written informed consent was obtained.  FINDINGS: Small uterus, normal adnexa bilaterally. Bilateral Filshie clips across fallopian tubes.  No evidence of endometriosis. Normal upper abdomen. Normal appearing appendix.  ANESTHESIA: General  ESTIMATED BLOOD LOSS: 350 ml  SPECIMENS: Uterus, cervix and bilateral fallopian tubes with Filshie clips in place COMPLICATIONS: None immediate  PROCEDURE IN DETAIL: The patient received intravenous antibiotics and had sequential compression devices applied to her lower extremities while in the preoperative area. She was then taken to the operating room where general anesthesia was administered and was found to be adequate. She was placed in the dorsal lithotomy position, and was prepped and draped in a sterile manner. An in and out catheterization was performed. An acorn uterine manipulator was then advanced into the uterus . After an adequate timeout was performed, attention was then turned to the patient's abdomen where a 10-mm skin incision was made in the umbilical fold. The Veress needle was carefully introduced into the peritoneal cavity through the abdominal wall. Intraperitoneal placement was confirmed by drop in  intraabdominal pressure with insufflation of carbon dioxide gas. Adequate pneumoperitoneum was obtained, and the 10/11 XL trocar and sleeve were then advanced without difficulty into the abdomen where intraabdominal placement was confirmed by the laparoscope. A survey of the patient's pelvis and abdomen revealed entirely normal anatomy. Suprapubic 5 XL port was then placed under direct visualization. The pelvis was then carefully examined. On the right side, the fallopian tube was elevated and freed from the mesosalpinx using the Gyrus.  The round ligament was then clamped and transected with the Gyrus. The uteroovarian ligament was also clamped and transected. The leaves of the broad ligament were separated and serially transected. These procedures were then repeated on the left side using the Gyrus. The ureters were noted to be safely away from the area of dissection.  At this point, attention was turned to the vaginal portion of the case. A weighted speculum was placed posteriorly, a Deaver anteriorly, and the cervix grasped with a thyroid tenaculum. Once the anterior and posterior reflections were identified, the cervix was circumscribed using the Bovie knife. Next, using Mayos, the posterior cul-de-sac was entered. The uterosacral ligaments were then clamped, cut and suture ligated with a tag.  Next, the bladder reflection was identified. Using Metzenbaums, it was entered and palpation and direct visualization confirmed proper location. Next, using the LigaSure, the uterine arteries were coapted and cut bilaterally. The pedicles were visualized after coaptation and were hemostatic. The same was performed sequentially cephalad until the uterus, cervix, and bilateral fallopian tubes were removed. The pedicles were inspected and found to be hemostatic.  Next, the tagged uterosacrals were tied together in midline. The remainder of the cuff closure was performed using monocryl figure of eight stitches in the same  fashion. The cuff was inspected and found to be hemostatic.   The cystoscopy was performed using the rigid 21-French cystoscope sheath.  The urethra and bladder were without injury or abnormality.  Fluorescein was given and bilateral efflux of urine was noted from both orifices.  Cystoscope was removed and Foley catheter was placed and drained fluorescein colored urine. Attention was returned to the abdomen were a second laparoscopic look was taken. All pedicles were hemostatic. Insufflation was removed after all instruments were removed.  Infraumbilical fascial incision was closed with 0 vicryl in figure of eight stitch.  All skin incisions were closed with 4-0 monocryl subcuticular stitches and Dermabond. The patient tolerated the procedures well. All instruments, needles, and sponge counts were correct x 2. The patient was taken to the recovery room awake, extubated and in stable condition.

## 2022-12-03 DIAGNOSIS — N920 Excessive and frequent menstruation with regular cycle: Secondary | ICD-10-CM | POA: Diagnosis not present

## 2022-12-03 DIAGNOSIS — J45909 Unspecified asthma, uncomplicated: Secondary | ICD-10-CM | POA: Diagnosis not present

## 2022-12-03 DIAGNOSIS — E039 Hypothyroidism, unspecified: Secondary | ICD-10-CM | POA: Diagnosis not present

## 2022-12-03 DIAGNOSIS — Z79899 Other long term (current) drug therapy: Secondary | ICD-10-CM | POA: Diagnosis not present

## 2022-12-03 DIAGNOSIS — N946 Dysmenorrhea, unspecified: Secondary | ICD-10-CM | POA: Diagnosis not present

## 2022-12-03 LAB — COMPREHENSIVE METABOLIC PANEL
ALT: 18 U/L (ref 0–44)
AST: 18 U/L (ref 15–41)
Albumin: 3.4 g/dL — ABNORMAL LOW (ref 3.5–5.0)
Alkaline Phosphatase: 42 U/L (ref 38–126)
Anion gap: 7 (ref 5–15)
BUN: 12 mg/dL (ref 6–20)
CO2: 26 mmol/L (ref 22–32)
Calcium: 8.5 mg/dL — ABNORMAL LOW (ref 8.9–10.3)
Chloride: 104 mmol/L (ref 98–111)
Creatinine, Ser: 0.66 mg/dL (ref 0.44–1.00)
GFR, Estimated: >60 mL/min (ref >60)
Glucose, Bld: 101 mg/dL — ABNORMAL HIGH (ref 70–99)
Potassium: 4.1 mmol/L (ref 3.5–5.1)
Sodium: 137 mmol/L (ref 135–145)
Total Bilirubin: 1 mg/dL (ref 0.3–1.2)
Total Protein: 6.4 g/dL — ABNORMAL LOW (ref 6.5–8.1)

## 2022-12-03 LAB — CBC
HCT: 30.8 % — ABNORMAL LOW (ref 36.0–46.0)
Hemoglobin: 10.3 g/dL — ABNORMAL LOW (ref 12.0–15.0)
MCH: 30.2 pg (ref 26.0–34.0)
MCHC: 33.4 g/dL (ref 30.0–36.0)
MCV: 90.3 fL (ref 80.0–100.0)
Platelets: 185 10*3/uL (ref 150–400)
RBC: 3.41 MIL/uL — ABNORMAL LOW (ref 3.87–5.11)
RDW: 13 % (ref 11.5–15.5)
WBC: 10.4 10*3/uL (ref 4.0–10.5)
nRBC: 0 % (ref 0.0–0.2)

## 2022-12-03 MED ORDER — KETOROLAC TROMETHAMINE 15 MG/ML IJ SOLN
INTRAMUSCULAR | Status: AC
  Filled 2022-12-03: qty 1

## 2022-12-03 MED ORDER — OXYCODONE HCL 5 MG PO TABS
ORAL_TABLET | ORAL | Status: AC
  Filled 2022-12-03: qty 2

## 2022-12-03 MED ORDER — ACETAMINOPHEN 500 MG PO TABS
ORAL_TABLET | ORAL | Status: AC
  Filled 2022-12-03: qty 2

## 2022-12-03 MED ORDER — OXYCODONE HCL 5 MG PO TABS: 5.0000 mg | ORAL_TABLET | ORAL | 0 refills | Status: AC | PRN

## 2022-12-03 MED ORDER — IBUPROFEN 600 MG PO TABS: 600.0000 mg | ORAL_TABLET | Freq: Four times a day (QID) | ORAL | 1 refills | Status: DC | PRN

## 2022-12-03 NOTE — Discharge Instructions (Signed)
Call MD for T>100.4, heavy vaginal bleeding, severe abdominal pain, intractable nausea and/or vomiting, or respiratory distress.  Call office to schedule postop visit in 2 weeks.  Pelvic rest x 6 weeks.  No driving while taking narcotics.  No heavy lifting.

## 2022-12-03 NOTE — Discharge Summary (Signed)
Physician Discharge Summary  Patient ID: Jacqueline Meadows MRN: [id number] DOB/AGE: 26-Sep-2000 22 y.o.  Admit date: 12/02/2022 Discharge date: 12/03/2022  Admission Diagnoses: Menorrhagia, dysmenorrhea  Discharge Diagnoses:  Principal Problem:   Menorrhagia Active Problems:   S/P laparoscopic assisted vaginal hysterectomy (LAVH)   Discharged Condition: good  Hospital Course: Patient was admitted for anticipated LAVH, BS which was completed without complication.  Please see operative note for details.  Patient had uncomplicated postop course and was discharged  home on POD#1.  She was meeting all postop goals: voiding without difficulty, pain control with po meds, ambulating well, tolerating po.    Consults: None  Significant Diagnostic Studies: none  Treatments: surgery: LAVH, BS, cysto  Discharge Exam: Blood pressure 114/64, pulse 80, temperature 98.2 F (36.8 C), resp. rate 14, height 5' 5.5" (1.664 m), weight 48.8 kg, last menstrual period 11/10/2022, SpO2 100 %, not currently breastfeeding. General appearance: alert, cooperative, and appears stated age Extremities: extremities normal, atraumatic, no cyanosis or edema Abd: soft, ND.  Inc c/d/I x 2  Disposition: Discharge disposition: 01-Home or Self Care       Discharge Instructions     Discharge patient   Complete by: As directed    Discharge disposition: 01-Home or Self Care   Discharge patient date: 12/03/2022      Allergies as of 12/03/2022       Reactions   Vicodin [hydrocodone-acetaminophen] Rash   Pt states she can take Oxycodone and plain tylenol         Medication List     TAKE these medications    acetaminophen 325 MG tablet Commonly known as: Tylenol Take 2 tablets (650 mg total) by mouth every 4 (four) hours as needed (for pain scale < 4).   ibuprofen 600 MG tablet Commonly known as: ADVIL Take 1 tablet (600 mg total) by mouth every 6 (six) hours as needed.   oxyCODONE 5 MG immediate  release tablet Commonly known as: Oxy IR/ROXICODONE Take 1 tablet (5 mg total) by mouth every 4 (four) hours as needed for up to 7 days for moderate pain.   solifenacin 10 MG tablet Commonly known as: VESICARE Take 10 mg by mouth daily.         Signed: Mitchel Honour 12/03/2022, 7:20 AM

## 2022-12-03 NOTE — Progress Notes (Signed)
1 Day Post-Op Procedure(s) (LRB): LAPAROSCOPIC ASSISTED VAGINAL HYSTERECTOMY WITH SALPINGECTOMY (Bilateral) CYSTOSCOPY  Subjective: Patient reports tolerating PO and no problems voiding.    Objective: I have reviewed patient's vital signs, intake and output, medications, and labs.  General: alert, cooperative, and appears stated age Extremities: extremities normal, atraumatic, no cyanosis or edema Abd: soft, ND.  Inc c/d/I x 2  Assessment: s/p Procedure(s): LAPAROSCOPIC ASSISTED VAGINAL HYSTERECTOMY WITH SALPINGECTOMY (Bilateral) CYSTOSCOPY: stable, progressing well, and tolerating diet  Plan: Discharge home  LOS: 0 days    Mitchel Honour, DO 12/03/2022, 7:15 AM

## 2022-12-06 ENCOUNTER — Encounter (HOSPITAL_BASED_OUTPATIENT_CLINIC_OR_DEPARTMENT_OTHER): Payer: Self-pay | Admitting: Obstetrics & Gynecology

## 2022-12-06 LAB — SURGICAL PATHOLOGY

## 2022-12-18 ENCOUNTER — Other Ambulatory Visit: Payer: Self-pay

## 2022-12-18 ENCOUNTER — Encounter (HOSPITAL_COMMUNITY): Payer: Self-pay | Admitting: *Deleted

## 2022-12-18 ENCOUNTER — Inpatient Hospital Stay (HOSPITAL_COMMUNITY)
Admission: AD | Admit: 2022-12-18 | Discharge: 2022-12-18 | Disposition: A | Discharge status: Home / Self Care | Payer: BC Managed Care – PPO | Attending: Obstetrics and Gynecology | Admitting: Obstetrics and Gynecology

## 2022-12-18 DIAGNOSIS — Z90710 Acquired absence of both cervix and uterus: Secondary | ICD-10-CM

## 2022-12-18 DIAGNOSIS — R109 Unspecified abdominal pain: Secondary | ICD-10-CM | POA: Insufficient documentation

## 2022-12-18 DIAGNOSIS — F419 Anxiety disorder, unspecified: Secondary | ICD-10-CM | POA: Diagnosis not present

## 2022-12-18 DIAGNOSIS — N939 Abnormal uterine and vaginal bleeding, unspecified: Secondary | ICD-10-CM

## 2022-12-18 LAB — CBC
HCT: 35.4 % — ABNORMAL LOW (ref 36.0–46.0)
Hemoglobin: 11.5 g/dL — ABNORMAL LOW (ref 12.0–15.0)
MCH: 28.8 pg (ref 26.0–34.0)
MCHC: 32.5 g/dL (ref 30.0–36.0)
MCV: 88.5 fL (ref 80.0–100.0)
Platelets: 326 10*3/uL (ref 150–400)
RBC: 4 MIL/uL (ref 3.87–5.11)
RDW: 13.1 % (ref 11.5–15.5)
WBC: 6.9 10*3/uL (ref 4.0–10.5)
nRBC: 0 % (ref 0.0–0.2)

## 2022-12-18 LAB — TYPE AND SCREEN
ABO/RH(D): A NEG
Antibody Screen: NEGATIVE

## 2022-12-18 NOTE — MAU Note (Signed)
Jacqueline Meadows is a 22 y.o. at Unknown here in MAU reporting: she's having bright red VB that began this morning.  States VB is similar to that of a menstrual cycle and has saturated a panty liner, denies passing blood clots. States s/p hysterectomy 12/02/2022 and VB had been just pink spotting.  Also reporting lower abdominal cramping but has had pain that radiates up into ribs that's "crushing". LMP: beginning of May Onset of complaint: today Pain score: 3 Vitals:   12/18/22 1603  BP: 121/74  Pulse: 86  Resp: 18  Temp: 98.3 F (36.8 C)  SpO2: 100%     FHT:NA Lab orders placed from triage:   None

## 2022-12-18 NOTE — MAU Provider Note (Signed)
History     CSN: [id number]  Arrival date and time: 12/18/22 1545   Event Date/Time   First Provider Initiated Contact with Patient 12/18/22 1616      Chief Complaint  Patient presents with   Vaginal Bleeding   HPI Jacqueline Meadows is a 22 y.o. [id number] GYN patient. She is s/p lap-assisted vaginal hysterectomy with bilateral salpingectomy on 05/30. Surgical history also significant for BTL in 2023. Patient presents with chief complaints of abdominal pain and heavy vaginal bleeding.  Abdominal pain  New onset at 1100 today. Patient states this was her first abnormal complaint. She experienced "cramps like huge contractions" across her entire torso. She describes the pain as "crushing". This occurred x 1 episode and immediately preceded her bleeding. Following this episode her abdominal pain returned to mild cramping consistent with her immediate post-op level of discomfort.  Heavy vaginal bleeding New onset immediately following her abdominal pain/. Patient states she passed a "period blob". The blob saturated her panty liner and her thong. She called the nurse line for her practice and was told she could continue to monitor her symptoms. She states she received a phone call from Dr. Imogene Burn and while on the phone she experienced additional heavy bleeding and was encouraged to present to MAU for evaluation. On arrival to MAU she reports that she has not seen additional heavy bleeding or clots.   Patient states she attended a post-op appointment with Dr. Langston Masker yesterday. Her symptoms were comparatively normal at that time. She was told her lap sites were healing appropriately. She did not undergo a vaginal exam due to the lack of acute complaints at that time.  On arrival to MAU patient verbalizes anxiety but denies dizziness, weakness, SOB, and syncope.  OB History     Gravida  3   Para  3   Term  2   Preterm  1   AB  0   Living  3      SAB  0   IAB  0   Ectopic  0    Multiple  0   Live Births  3           Past Medical History:  Diagnosis Date   Anxiety    hx of anxiety, resolved   Asthma    as a child   Frequent urination    Follows w/ Dr. Mena Goes, Urology.   Hypothyroidism    Follows w/ Fredia Sorrow, NP, endocrinology for Hashimoto's. As of 2024, patient is no longer on meds.   Migraine    hx of migraines as a child   Premature baby 2020   @ 36 weeks    Past Surgical History:  Procedure Laterality Date   CYSTOSCOPY  12/02/2022   Procedure: CYSTOSCOPY;  Surgeon: Mitchel Honour, DO;  Location: [facility];  Service: Gynecology;;   FOOT SURGERY Bilateral 03-2015   Rods in both feet   LAPAROSCOPIC TUBAL LIGATION Bilateral 08/14/2021   Procedure: LAPAROSCOPIC TUBAL LIGATION WITH FILSHIE CLIPS;  Surgeon: Mitchel Honour, DO;  Location: [facility];  Service: Gynecology;  Laterality: Bilateral;   LAPAROSCOPIC VAGINAL HYSTERECTOMY WITH SALPINGECTOMY Bilateral 12/02/2022   Procedure: LAPAROSCOPIC ASSISTED VAGINAL HYSTERECTOMY WITH SALPINGECTOMY;  Surgeon: Mitchel Honour, DO;  Location: [facility];  Service: Gynecology;  Laterality: Bilateral;    Family History  Problem Relation Age of Onset   Anxiety disorder Mother    Depression Mother    ADD / ADHD Mother  Bipolar disorder Mother    Migraines Mother    Hearing loss Mother    Anxiety disorder Brother    Depression Brother    Hearing loss Brother    Bipolar disorder Paternal Aunt        3 paternal aunts bipolar dx and on disability   Suicidality Paternal Aunt        paternal aunt attempted suicide 2 times   Other Paternal Aunt        Hx of substance abuse   Cancer Paternal Aunt    ADD / ADHD Brother    Cancer Maternal Grandmother        ovarian uterine colon   Ovarian cancer Maternal Aunt    Cancer Paternal Uncle     Social History   Tobacco Use   Smoking status: Never   Smokeless tobacco: Never  Vaping Use   Vaping Use: Never  used  Substance Use Topics   Alcohol use: No   Drug use: No    Allergies:  Allergies  Allergen Reactions   Vicodin [Hydrocodone-Acetaminophen] Rash    Pt states she can take Oxycodone and plain tylenol     Medications Prior to Admission  Medication Sig Dispense Refill Last Dose   acetaminophen (TYLENOL) 325 MG tablet Take 2 tablets (650 mg total) by mouth every 4 (four) hours as needed (for pain scale < 4). 30 tablet 0    ibuprofen (ADVIL) 600 MG tablet Take 1 tablet (600 mg total) by mouth every 6 (six) hours as needed. 30 tablet 1    solifenacin (VESICARE) 10 MG tablet Take 10 mg by mouth daily.       Review of Systems  Gastrointestinal:  Positive for abdominal pain.  Genitourinary:  Positive for vaginal bleeding.  All other systems reviewed and are negative.  Physical Exam   Blood pressure 121/74, pulse 86, temperature 98.3 F (36.8 C), temperature source Oral, resp. rate 18, height 5\' 5"  (1.651 m), weight 46.7 kg, last menstrual period 11/10/2022, SpO2 100 %, not currently breastfeeding.  Physical Exam Vitals and nursing note reviewed. Exam conducted with a chaperone present.  Constitutional:      General: She is not in acute distress.    Appearance: Normal appearance. She is not ill-appearing or toxic-appearing.  Cardiovascular:     Rate and Rhythm: Normal rate and regular rhythm.     Pulses: Normal pulses.     Heart sounds: Normal heart sounds.  Pulmonary:     Effort: Pulmonary effort is normal.     Breath sounds: Normal breath sounds.  Abdominal:     General: Abdomen is flat.  Skin:    Capillary Refill: Capillary refill takes less than 2 seconds.  Neurological:     Mental Status: She is alert and oriented to person, place, and time.  Psychiatric:        Mood and Affect: Mood normal.        Behavior: Behavior normal.        Thought Content: Thought content normal.        Judgment: Judgment normal.     MAU Course  Procedures  MDM --No small speculums  on unit. No visible bleeding on pad, vitals stable. Gentle bimanual performed by CNM with verbal consent from patient. Scant dark red blood on glove. Scant trickle of dark red blood vagina at conclusion of exam. Tolerated very well by patient  --ROS, bimanual exam and patient condition on arrival to MAU reviewed with Dr. Debroah Loop. Clinical management  not likely to benefit from imaging. Will defer decision point until CBC is obtained  --Reassuring CBC. Vitals stable. No recurrent of abdominal pain or heavy bleeding. Will discharge home with plan to coordinate outpatient appointment with Dr. Langston Masker Monday or Tuesday.   Orders Placed This Encounter  Procedures   CBC   Type and screen [facility]   Discharge patient   Results for orders placed or performed during the hospital encounter of 12/18/22 (from the past 24 hour(s))  Type and screen [facility]     Status: None   Collection Time: 12/18/22  5:06 PM  Result Value Ref Range   ABO/RH(D) A NEG    Antibody Screen NEG    Sample Expiration      12/21/2022,2359 Performed at [facility], [address], [place], [place] [address]   CBC     Status: Abnormal   Collection Time: 12/18/22  5:10 PM  Result Value Ref Range   WBC 6.9 4.0 - 10.5 K/uL   RBC 4.00 3.87 - 5.11 MIL/uL   Hemoglobin 11.5 (L) 12.0 - 15.0 g/dL   HCT 60.4 (L) 54.0 - 98.1 %   MCV 88.5 80.0 - 100.0 fL   MCH 28.8 26.0 - 34.0 pg   MCHC 32.5 30.0 - 36.0 g/dL   RDW 19.1 47.8 - 29.5 %   Platelets 326 150 - 400 K/uL   nRBC 0.0 0.0 - 0.2 %   Assessment and Plan  --22 y.o. [id number]  --S/p lap-assisted vaginal hysterectomy with bilateral salpingectomy --Hgb 11.5 --Patient without recurrence of chief complaints, well-appearing throughout encounter --Care coordinated with Dr. Debroah Loop and Dr. Imogene Burn prior to discharge --Discharge home in stable condition  F/U: --Patient encouraged to call office first thing Monday for appointment  Clayton Bibles, MSA, MSN, CNM Certified Nurse Midwife, [facility] for [employer], [facility]

## 2022-12-20 DIAGNOSIS — N99840 Postprocedural hematoma of a genitourinary system organ or structure following a genitourinary system procedure: Secondary | ICD-10-CM | POA: Diagnosis not present

## 2023-05-23 DIAGNOSIS — E063 Autoimmune thyroiditis: Secondary | ICD-10-CM | POA: Diagnosis not present

## 2023-05-27 DIAGNOSIS — L608 Other nail disorders: Secondary | ICD-10-CM | POA: Diagnosis not present

## 2023-07-11 DIAGNOSIS — L609 Nail disorder, unspecified: Secondary | ICD-10-CM | POA: Diagnosis not present

## 2023-07-12 DIAGNOSIS — L609 Nail disorder, unspecified: Secondary | ICD-10-CM | POA: Diagnosis not present

## 2023-09-12 DIAGNOSIS — E063 Autoimmune thyroiditis: Secondary | ICD-10-CM | POA: Diagnosis not present

## 2023-11-28 ENCOUNTER — Inpatient Hospital Stay (HOSPITAL_COMMUNITY)
Admission: AD | Admit: 2023-11-28 | Discharge: 2023-11-28 | Disposition: A | Discharge status: Home / Self Care | Attending: Obstetrics and Gynecology | Admitting: Obstetrics and Gynecology

## 2023-11-28 DIAGNOSIS — Z90710 Acquired absence of both cervix and uterus: Secondary | ICD-10-CM | POA: Diagnosis not present

## 2023-11-28 DIAGNOSIS — N939 Abnormal uterine and vaginal bleeding, unspecified: Secondary | ICD-10-CM

## 2023-11-28 DIAGNOSIS — R102 Pelvic and perineal pain: Secondary | ICD-10-CM | POA: Diagnosis not present

## 2023-11-28 LAB — WET PREP, GENITAL
Clue Cells Wet Prep HPF POC: NONE SEEN
Sperm: NONE SEEN
Trich, Wet Prep: NONE SEEN
WBC, Wet Prep HPF POC: <10 (ref <10)
Yeast Wet Prep HPF POC: NONE SEEN

## 2023-11-28 NOTE — MAU Note (Signed)
..  Jacqueline Meadows is a 23 y.o. here in MAU reporting: had a partial hysterectomy 2 years ago after her baby. She states that this morning she woke up and noticed that she had a small amount of vaginal bleeding and it scared her. She also reports that she has a lot of pain in her pelvis. She has a history of vaginal prolapse and ovarian cysts. Denies recent intercourse.   Pain score: 7 Vitals:   11/28/23 0959  BP: 121/74  Pulse: 96  Resp: 14  Temp: 98.2 F (36.8 C)  SpO2: 100%

## 2023-11-28 NOTE — MAU Provider Note (Signed)
 Chief Complaint: Vaginal Bleeding   None     SUBJECTIVE HPI: Jacqueline Meadows is a 23 y.o. [phone] who is s/p partial hysterectomy 2 years ago who presents to maternity admissions reporting onset of vaginal bleeding today. She also reports pelvic pain, with one prior attempt at pelvic floor PT since her hysterectomy but had a bad experience with a female therapist.     She denies vaginal bleeding, vaginal itching/burning, urinary symptoms, h/a, dizziness, n/v, or fever/chills.     HPI  Past Medical History:  Diagnosis Date  . Anxiety    hx of anxiety, resolved  . Asthma    as a child  . Frequent urination    Follows w/ Dr. Derrick Fling, Urology.  . Hypothyroidism    Follows w/ Donis Furnish, NP, endocrinology for Hashimoto's. As of 2024, patient is no longer on meds.  . Migraine    hx of migraines as a child  . Premature baby 2020   @ 36 weeks   Past Surgical History:  Procedure Laterality Date  . CYSTOSCOPY  12/02/2022   Procedure: CYSTOSCOPY;  Surgeon: Dyanna Glasgow, DO;  Location: [facility];  Service: Gynecology;;  . FOOT SURGERY Bilateral 03-2015   Rods in both feet  . LAPAROSCOPIC TUBAL LIGATION Bilateral 08/14/2021   Procedure: LAPAROSCOPIC TUBAL LIGATION WITH FILSHIE CLIPS;  Surgeon: Dyanna Glasgow, DO;  Location: [facility];  Service: Gynecology;  Laterality: Bilateral;  . LAPAROSCOPIC VAGINAL HYSTERECTOMY WITH SALPINGECTOMY Bilateral 12/02/2022   Procedure: LAPAROSCOPIC ASSISTED VAGINAL HYSTERECTOMY WITH SALPINGECTOMY;  Surgeon: Dyanna Glasgow, DO;  Location: [facility];  Service: Gynecology;  Laterality: Bilateral;   Social History   Socioeconomic History  . Marital status: Single    Spouse name: Not on file  . Number of children: Not on file  . Years of education: Not on file  . Highest education level: Not on file  Occupational History  . Occupation: Lobbyist: [employer]  Tobacco Use  . Smoking status:  Never  . Smokeless tobacco: Never  Vaping Use  . Vaping status: Never Used  Substance and Sexual Activity  . Alcohol use: No  . Drug use: No  . Sexual activity: Yes    Birth control/protection: Surgical  Other Topics Concern  . Not on file  Social History Narrative  . Not on file   Social Drivers of Health   Financial Resource Strain: Low Risk  (07/08/2018)   Overall Financial Resource Strain (CARDIA)   . Difficulty of Paying Living Expenses: Not very hard  Food Insecurity: Low Risk  (06/24/2023)   Received from [facility]   Hunger Vital Sign   . Worried About Programme researcher, broadcasting/film/video in the Last Year: Never true   . Ran Out of Food in the Last Year: Never true  Transportation Needs: No Transportation Needs (06/24/2023)   Received from [facility]   . In the past 12 months, has lack of reliable transportation kept you from medical appointments, meetings, work or from getting things needed for daily living? : No  Physical Activity: Sufficiently Active (07/08/2018)   Exercise Vital Sign   . Days of Exercise per Week: 6 days   . Minutes of Exercise per Session: 60 min  Stress: No Stress Concern Present (07/08/2018)   [employer] of Occupational Health - Occupational Stress Questionnaire   . Feeling of Stress : Only a little  Social Connections: Not on file  Intimate Partner  Violence: Not At Risk (07/08/2018)   Humiliation, Afraid, Rape, and Kick questionnaire   . Fear of Current or Ex-Partner: No   . Emotionally Abused: No   . Physically Abused: No   . Sexually Abused: No   No current facility-administered medications on file prior to encounter.   No current outpatient medications on file prior to encounter.   Allergies  Allergen Reactions  . Vicodin [Hydrocodone-Acetaminophen ] Rash    Pt states she can take Oxycodone  and plain tylenol      ROS:  Review of Systems   I have reviewed patient's Past Medical Hx, Surgical Hx, Family Hx, Social Hx,  medications and allergies.   Physical Exam  Patient Vitals for the past 24 hrs:  BP Temp Temp src Pulse Resp SpO2 Height Weight  11/28/23 0959 121/74 98.2 F (36.8 C) Oral 96 14 100 % 5' 5.5" (1.664 m) 46.7 kg   Constitutional: Well-developed, well-nourished female in no acute distress.  Cardiovascular: normal rate Respiratory: normal effort GI: Abd soft, non-tender. Pos BS x 4 MS: Extremities nontender, no edema, normal ROM Neurologic: Alert and oriented x 4.  GU: Neg CVAT.  PELVIC EXAM: Cervix pink, visually closed, without lesion, scant white creamy discharge, vaginal walls and external genitalia normal Bimanual exam: Cervix 0/long/high, firm, anterior, neg CMT, uterus nontender, nonenlarged, adnexa without tenderness, enlargement, or mass  FHT *** by doppler  LAB RESULTS No results found for this or any previous visit (from the past 24 hours).  --/--/A NEG (06/15 1706)  IMAGING No results found.  MAU Management/MDM: No orders of the defined types were placed in this encounter.   No orders of the defined types were placed in this encounter.   Consult ***.  Treatments in MAU included ***. Pt discharged with strict *** precautions.  ASSESSMENT No diagnosis found.  PLAN Discharge home Allergies as of 11/28/2023       Reactions   Vicodin [hydrocodone-acetaminophen ] Rash   Pt states she can take Oxycodone  and plain tylenol          Medication List    You have not been prescribed any medications.      Arlester Bence Certified Nurse-Midwife 11/28/2023  11:51 AM

## 2023-11-29 LAB — GC/CHLAMYDIA PROBE AMP (~~LOC~~) NOT AT ARMC
Chlamydia: NEGATIVE
Comment: NEGATIVE
Comment: NORMAL
Neisseria Gonorrhea: NEGATIVE

## 2023-11-30 DIAGNOSIS — N939 Abnormal uterine and vaginal bleeding, unspecified: Secondary | ICD-10-CM | POA: Diagnosis not present

## 2023-11-30 DIAGNOSIS — D509 Iron deficiency anemia, unspecified: Secondary | ICD-10-CM | POA: Diagnosis not present

## 2023-11-30 DIAGNOSIS — Z681 Body mass index (BMI) 19 or less, adult: Secondary | ICD-10-CM | POA: Diagnosis not present

## 2023-11-30 DIAGNOSIS — R102 Pelvic and perineal pain: Secondary | ICD-10-CM | POA: Diagnosis not present

## 2023-12-01 DIAGNOSIS — R102 Pelvic and perineal pain: Secondary | ICD-10-CM | POA: Diagnosis not present

## 2023-12-01 DIAGNOSIS — N760 Acute vaginitis: Secondary | ICD-10-CM | POA: Diagnosis not present

## 2023-12-01 DIAGNOSIS — Z113 Encounter for screening for infections with a predominantly sexual mode of transmission: Secondary | ICD-10-CM | POA: Diagnosis not present

## 2024-01-27 ENCOUNTER — Ambulatory Visit: Admitting: Physical Therapy

## 2024-03-21 ENCOUNTER — Ambulatory Visit: Admitting: Physical Therapy

## 2024-04-30 DIAGNOSIS — R509 Fever, unspecified: Secondary | ICD-10-CM | POA: Diagnosis not present

## 2024-04-30 DIAGNOSIS — R6883 Chills (without fever): Secondary | ICD-10-CM | POA: Diagnosis not present

## 2024-04-30 DIAGNOSIS — R0602 Shortness of breath: Secondary | ICD-10-CM | POA: Diagnosis not present

## 2024-04-30 DIAGNOSIS — R053 Chronic cough: Secondary | ICD-10-CM | POA: Diagnosis not present

## 2024-04-30 DIAGNOSIS — R0989 Other specified symptoms and signs involving the circulatory and respiratory systems: Secondary | ICD-10-CM | POA: Diagnosis not present

## 2024-04-30 DIAGNOSIS — Z03818 Encounter for observation for suspected exposure to other biological agents ruled out: Secondary | ICD-10-CM | POA: Diagnosis not present

## 2024-05-15 ENCOUNTER — Other Ambulatory Visit: Payer: Self-pay | Admitting: Family

## 2024-05-15 DIAGNOSIS — N6452 Nipple discharge: Secondary | ICD-10-CM | POA: Diagnosis not present

## 2024-06-07 ENCOUNTER — Other Ambulatory Visit

## 2024-07-03 ENCOUNTER — Other Ambulatory Visit
